# Patient Record
Sex: Male | Born: 1970 | Race: White | Hispanic: No | Smoking: Never smoker
Health system: Southern US, Community
[De-identification: ages and names within clinical notes are randomized; demographics above are authoritative.]

## PROBLEM LIST (undated history)

## (undated) DIAGNOSIS — I456 Pre-excitation syndrome: Secondary | ICD-10-CM

## (undated) DIAGNOSIS — E079 Disorder of thyroid, unspecified: Secondary | ICD-10-CM

## (undated) DIAGNOSIS — K259 Gastric ulcer, unspecified as acute or chronic, without hemorrhage or perforation: Secondary | ICD-10-CM

## (undated) DIAGNOSIS — F329 Major depressive disorder, single episode, unspecified: Secondary | ICD-10-CM

## (undated) DIAGNOSIS — M1990 Unspecified osteoarthritis, unspecified site: Secondary | ICD-10-CM

## (undated) DIAGNOSIS — F32A Depression, unspecified: Secondary | ICD-10-CM

## (undated) DIAGNOSIS — F419 Anxiety disorder, unspecified: Secondary | ICD-10-CM

## (undated) DIAGNOSIS — K219 Gastro-esophageal reflux disease without esophagitis: Secondary | ICD-10-CM

## (undated) HISTORY — DX: Gastro-esophageal reflux disease without esophagitis: K21.9

## (undated) HISTORY — DX: Depression, unspecified: F32.A

## (undated) HISTORY — DX: Major depressive disorder, single episode, unspecified: F32.9

## (undated) HISTORY — PX: TONSILLECTOMY: SUR1361

## (undated) HISTORY — DX: Pre-excitation syndrome: I45.6

## (undated) HISTORY — DX: Gastric ulcer, unspecified as acute or chronic, without hemorrhage or perforation: K25.9

## (undated) HISTORY — DX: Anxiety disorder, unspecified: F41.9

## (undated) HISTORY — DX: Unspecified osteoarthritis, unspecified site: M19.90

## (undated) HISTORY — DX: Disorder of thyroid, unspecified: E07.9

---

## 2000-10-14 ENCOUNTER — Emergency Department (HOSPITAL_COMMUNITY): Admission: EM | Admit: 2000-10-14 | Discharge: 2000-10-14 | Payer: Self-pay | Admitting: Emergency Medicine

## 2017-07-08 DIAGNOSIS — K219 Gastro-esophageal reflux disease without esophagitis: Secondary | ICD-10-CM | POA: Diagnosis not present

## 2017-07-08 DIAGNOSIS — J329 Chronic sinusitis, unspecified: Secondary | ICD-10-CM | POA: Diagnosis not present

## 2017-07-08 DIAGNOSIS — R0789 Other chest pain: Secondary | ICD-10-CM | POA: Diagnosis not present

## 2017-07-08 DIAGNOSIS — B079 Viral wart, unspecified: Secondary | ICD-10-CM | POA: Diagnosis not present

## 2017-07-23 DIAGNOSIS — L0100 Impetigo, unspecified: Secondary | ICD-10-CM | POA: Diagnosis not present

## 2017-07-23 DIAGNOSIS — D2239 Melanocytic nevi of other parts of face: Secondary | ICD-10-CM | POA: Diagnosis not present

## 2017-07-23 DIAGNOSIS — B079 Viral wart, unspecified: Secondary | ICD-10-CM | POA: Diagnosis not present

## 2017-07-23 DIAGNOSIS — L570 Actinic keratosis: Secondary | ICD-10-CM | POA: Diagnosis not present

## 2017-08-27 DIAGNOSIS — B079 Viral wart, unspecified: Secondary | ICD-10-CM | POA: Diagnosis not present

## 2017-09-02 DIAGNOSIS — J329 Chronic sinusitis, unspecified: Secondary | ICD-10-CM | POA: Diagnosis not present

## 2017-09-02 DIAGNOSIS — F418 Other specified anxiety disorders: Secondary | ICD-10-CM | POA: Diagnosis not present

## 2017-09-02 DIAGNOSIS — Z1331 Encounter for screening for depression: Secondary | ICD-10-CM | POA: Diagnosis not present

## 2017-09-02 DIAGNOSIS — K52832 Lymphocytic colitis: Secondary | ICD-10-CM | POA: Diagnosis not present

## 2017-09-02 DIAGNOSIS — K219 Gastro-esophageal reflux disease without esophagitis: Secondary | ICD-10-CM | POA: Diagnosis not present

## 2017-09-17 DIAGNOSIS — B079 Viral wart, unspecified: Secondary | ICD-10-CM | POA: Diagnosis not present

## 2017-09-26 DIAGNOSIS — T63481A Toxic effect of venom of other arthropod, accidental (unintentional), initial encounter: Secondary | ICD-10-CM | POA: Diagnosis not present

## 2017-10-06 DIAGNOSIS — F419 Anxiety disorder, unspecified: Secondary | ICD-10-CM | POA: Diagnosis not present

## 2017-10-06 DIAGNOSIS — F418 Other specified anxiety disorders: Secondary | ICD-10-CM | POA: Diagnosis not present

## 2017-10-06 DIAGNOSIS — M1612 Unilateral primary osteoarthritis, left hip: Secondary | ICD-10-CM | POA: Diagnosis not present

## 2017-10-06 DIAGNOSIS — Z6832 Body mass index (BMI) 32.0-32.9, adult: Secondary | ICD-10-CM | POA: Diagnosis not present

## 2017-10-13 ENCOUNTER — Encounter (INDEPENDENT_AMBULATORY_CARE_PROVIDER_SITE_OTHER): Payer: Self-pay | Admitting: Orthopaedic Surgery

## 2017-10-13 ENCOUNTER — Ambulatory Visit (INDEPENDENT_AMBULATORY_CARE_PROVIDER_SITE_OTHER): Payer: BLUE CROSS/BLUE SHIELD

## 2017-10-13 ENCOUNTER — Ambulatory Visit (INDEPENDENT_AMBULATORY_CARE_PROVIDER_SITE_OTHER): Payer: BLUE CROSS/BLUE SHIELD | Admitting: Orthopaedic Surgery

## 2017-10-13 VITALS — BP 146/102 | HR 81 | Ht 75.0 in | Wt 248.0 lb

## 2017-10-13 DIAGNOSIS — M1612 Unilateral primary osteoarthritis, left hip: Secondary | ICD-10-CM | POA: Diagnosis not present

## 2017-10-13 DIAGNOSIS — M25552 Pain in left hip: Secondary | ICD-10-CM | POA: Diagnosis not present

## 2017-10-13 NOTE — Progress Notes (Signed)
Office Visit Note   Patient: Bryce Kelley           Date of Birth: 03-Sep-1970           MRN: [id number] Visit Date: 10/13/2017              Requested by: Lonie Peak, PA-C [address] [place], [place] [address] PCP: System, Pcp Not In   Assessment & Plan: Visit Diagnoses:  1. Pain in left hip   2. Unilateral primary osteoarthritis, left hip     Plan: We reviewed x-rays today I made copies for him of today's x-rays in 2015 which shows progression from moderate to severe osteoarthritis of his left hip with time.  He is having daily symptoms and works on his feet all day.  He has not missed work as of this point due to his hip symptoms but his pain is progressed.  We discussed total hip arthroplasty with direct anterior approach.  Out of work in the 3 to 6-week range postoperatively.  We discussed spinal anesthesia, direct anterior approach, risks of surgery.  He can discuss this with his wife and call when he is ready to proceed.  Questions were elicited and answered about the procedure.  Follow-Up Instructions: No follow-ups on file.   Orders:  Orders Placed This Encounter  Procedures  . XR HIP UNILAT W OR W/O PELVIS 2-3 VIEWS LEFT   No orders of the defined types were placed in this encounter.     Procedures: No procedures performed   Clinical Data: No additional findings.   Subjective: Chief Complaint  Patient presents with  . Left Hip - Pain    HPI 47 year old male returns with increasing left hip pain in his groin with limp.  He has not been seen since 2015 for radiograph showed moderate left hip osteoarthritis.  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  He is taken Tylenol and also Aleve and tries to avoid taking it every single day.  He is gotten some relief with medication.  His limp is gotten worse and he return for repeat radiographs due to his progression of symptoms.  He has a father at hip  osteoarthritis also brother with some hip problems who has not had surgery.  Patient denies associated back pain.  No bowel or bladder symptoms.  No numbness or tingling.  Patient is not able to cross his legs or figure-of-four his leg.  He notes internal rotation extremely limited in his left hip.  No limitation with right leg range of motion.  Review of Systems positive for thyroid condition, GERD, past history of anxiety, depression and acid reflux.  Currently he is taking Prilosec, the Tylenol and also Aleve.  14 point review of systems otherwise negative as it pertains HPI.   Objective: Vital Signs: BP (!) 146/102   Pulse 81   Ht 6\' 3"  (1.905 m)   Wt 248 lb (112.5 kg)   BMI 31.00 kg/m   Physical Exam  Constitutional: He is oriented to person, place, and time. He appears well-developed and well-nourished.  HENT:  Head: Normocephalic and atraumatic.  Eyes: Pupils are equal, round, and reactive to light. EOM are normal.  Neck: No tracheal deviation present. No thyromegaly present.  Cardiovascular: Normal rate.  Pulmonary/Chest: Effort normal. He has no wheezes.  Abdominal: Soft. Bowel sounds are normal.  Neurological: He is alert and oriented to person, place, and time.  Skin:  Skin is warm and dry. Capillary refill takes less than 2 seconds.  Psychiatric: He has a normal mood and affect. His behavior is normal. Judgment and thought content normal.    Ortho Exam patient ambulates with a positive Trendelenburg gait.  0 degrees internal rotation left hip negative straight leg raising 90 degrees.  Both knees show the slight crepitus without knee effusion normal ligament stability.  Distal pulses are 2+ and symmetrical.  45 degrees internal and external rotation of his right hip.  Left hip is 0 degrees internal rotation only 30 degrees external rotation both with pain.  Anterior tib quads abductors ankle dorsiflexion plantarflexion is normal.  Specialty Comments:  No specialty comments  available.  Imaging: Xr Hip Unilat W Or W/o Pelvis 2-3 Views Left  Result Date: 10/13/2017 Standing AP both hips and frog-leg left hip x-rays are obtained and reviewed compared to 2015 x-rays.  This shows significant progression of left hip osteoarthritis with complete loss of joint space, subchondral sclerosis, 1 cm inferior marginal osteophytes on the left hip.  Right hip is normal without osteoarthritic changes. Impression: Moderate to severe left hip osteoarthritis which has progressed from 2015 images.    PMFS History: There are no active problems to display for this patient.  Past Medical History:  Diagnosis Date  . Acid reflux   . Anxiety   . Arthritis   . Depression   . Gastric ulcer   . Thyroid disease     History reviewed. No pertinent family history.  History reviewed. No pertinent surgical history. Social History   Occupational History  . Not on file  Tobacco Use  . Smoking status: Never Smoker  . Smokeless tobacco: Never Used  Substance and Sexual Activity  . Alcohol use: Not Currently  . Drug use: Not on file  . Sexual activity: Not on file

## 2017-10-13 NOTE — Progress Notes (Deleted)
.  srs 

## 2017-10-31 DIAGNOSIS — B079 Viral wart, unspecified: Secondary | ICD-10-CM | POA: Diagnosis not present

## 2017-11-06 DIAGNOSIS — F418 Other specified anxiety disorders: Secondary | ICD-10-CM | POA: Diagnosis not present

## 2017-11-06 DIAGNOSIS — M1612 Unilateral primary osteoarthritis, left hip: Secondary | ICD-10-CM | POA: Diagnosis not present

## 2017-11-06 DIAGNOSIS — J329 Chronic sinusitis, unspecified: Secondary | ICD-10-CM | POA: Diagnosis not present

## 2017-11-28 DIAGNOSIS — B079 Viral wart, unspecified: Secondary | ICD-10-CM | POA: Diagnosis not present

## 2017-12-14 DIAGNOSIS — T7840XA Allergy, unspecified, initial encounter: Secondary | ICD-10-CM | POA: Diagnosis not present

## 2017-12-14 DIAGNOSIS — L500 Allergic urticaria: Secondary | ICD-10-CM | POA: Diagnosis not present

## 2017-12-15 DIAGNOSIS — M79671 Pain in right foot: Secondary | ICD-10-CM | POA: Diagnosis not present

## 2017-12-15 DIAGNOSIS — L509 Urticaria, unspecified: Secondary | ICD-10-CM | POA: Diagnosis not present

## 2017-12-31 DIAGNOSIS — B079 Viral wart, unspecified: Secondary | ICD-10-CM | POA: Diagnosis not present

## 2018-01-12 DIAGNOSIS — B079 Viral wart, unspecified: Secondary | ICD-10-CM | POA: Diagnosis not present

## 2018-01-19 DIAGNOSIS — J329 Chronic sinusitis, unspecified: Secondary | ICD-10-CM | POA: Diagnosis not present

## 2018-01-19 DIAGNOSIS — Z6831 Body mass index (BMI) 31.0-31.9, adult: Secondary | ICD-10-CM | POA: Diagnosis not present

## 2018-01-19 DIAGNOSIS — H6122 Impacted cerumen, left ear: Secondary | ICD-10-CM | POA: Diagnosis not present

## 2018-01-26 DIAGNOSIS — B079 Viral wart, unspecified: Secondary | ICD-10-CM | POA: Diagnosis not present

## 2018-03-24 ENCOUNTER — Ambulatory Visit (INDEPENDENT_AMBULATORY_CARE_PROVIDER_SITE_OTHER): Payer: BLUE CROSS/BLUE SHIELD | Admitting: Orthopaedic Surgery

## 2018-03-31 DIAGNOSIS — Z6830 Body mass index (BMI) 30.0-30.9, adult: Secondary | ICD-10-CM | POA: Diagnosis not present

## 2018-03-31 DIAGNOSIS — J329 Chronic sinusitis, unspecified: Secondary | ICD-10-CM | POA: Diagnosis not present

## 2018-04-16 DIAGNOSIS — Z6830 Body mass index (BMI) 30.0-30.9, adult: Secondary | ICD-10-CM | POA: Diagnosis not present

## 2018-04-16 DIAGNOSIS — J329 Chronic sinusitis, unspecified: Secondary | ICD-10-CM | POA: Diagnosis not present

## 2018-04-30 DIAGNOSIS — J329 Chronic sinusitis, unspecified: Secondary | ICD-10-CM | POA: Diagnosis not present

## 2018-04-30 DIAGNOSIS — J310 Chronic rhinitis: Secondary | ICD-10-CM | POA: Diagnosis not present

## 2018-05-06 ENCOUNTER — Other Ambulatory Visit: Payer: Self-pay | Admitting: Otolaryngology

## 2018-05-06 DIAGNOSIS — J329 Chronic sinusitis, unspecified: Secondary | ICD-10-CM

## 2018-05-07 ENCOUNTER — Ambulatory Visit
Admission: RE | Admit: 2018-05-07 | Discharge: 2018-05-07 | Disposition: A | Discharge status: Home / Self Care | Payer: BLUE CROSS/BLUE SHIELD | Source: Ambulatory Visit | Attending: Otolaryngology | Admitting: Otolaryngology

## 2018-05-07 DIAGNOSIS — K029 Dental caries, unspecified: Secondary | ICD-10-CM | POA: Diagnosis not present

## 2018-05-07 DIAGNOSIS — J329 Chronic sinusitis, unspecified: Secondary | ICD-10-CM

## 2018-05-08 DIAGNOSIS — J310 Chronic rhinitis: Secondary | ICD-10-CM | POA: Diagnosis not present

## 2018-05-08 DIAGNOSIS — G44201 Tension-type headache, unspecified, intractable: Secondary | ICD-10-CM | POA: Diagnosis not present

## 2018-05-18 DIAGNOSIS — K219 Gastro-esophageal reflux disease without esophagitis: Secondary | ICD-10-CM | POA: Diagnosis not present

## 2018-05-18 DIAGNOSIS — J329 Chronic sinusitis, unspecified: Secondary | ICD-10-CM | POA: Diagnosis not present

## 2018-05-18 DIAGNOSIS — R51 Headache: Secondary | ICD-10-CM | POA: Diagnosis not present

## 2018-05-18 DIAGNOSIS — M1612 Unilateral primary osteoarthritis, left hip: Secondary | ICD-10-CM | POA: Diagnosis not present

## 2018-05-31 DIAGNOSIS — R5383 Other fatigue: Secondary | ICD-10-CM | POA: Diagnosis not present

## 2018-05-31 DIAGNOSIS — R197 Diarrhea, unspecified: Secondary | ICD-10-CM | POA: Diagnosis not present

## 2018-05-31 DIAGNOSIS — R509 Fever, unspecified: Secondary | ICD-10-CM | POA: Diagnosis not present

## 2018-06-04 DIAGNOSIS — R197 Diarrhea, unspecified: Secondary | ICD-10-CM | POA: Diagnosis not present

## 2018-06-04 DIAGNOSIS — R6889 Other general symptoms and signs: Secondary | ICD-10-CM | POA: Diagnosis not present

## 2018-06-04 DIAGNOSIS — Z6830 Body mass index (BMI) 30.0-30.9, adult: Secondary | ICD-10-CM | POA: Diagnosis not present

## 2018-06-23 DIAGNOSIS — H6980 Other specified disorders of Eustachian tube, unspecified ear: Secondary | ICD-10-CM | POA: Diagnosis not present

## 2018-09-21 DIAGNOSIS — Z6831 Body mass index (BMI) 31.0-31.9, adult: Secondary | ICD-10-CM | POA: Diagnosis not present

## 2018-09-21 DIAGNOSIS — M26629 Arthralgia of temporomandibular joint, unspecified side: Secondary | ICD-10-CM | POA: Diagnosis not present

## 2018-09-21 DIAGNOSIS — F418 Other specified anxiety disorders: Secondary | ICD-10-CM | POA: Diagnosis not present

## 2018-09-29 ENCOUNTER — Ambulatory Visit: Payer: BLUE CROSS/BLUE SHIELD | Admitting: Orthopaedic Surgery

## 2018-10-21 DIAGNOSIS — J329 Chronic sinusitis, unspecified: Secondary | ICD-10-CM | POA: Diagnosis not present

## 2018-10-21 DIAGNOSIS — K219 Gastro-esophageal reflux disease without esophagitis: Secondary | ICD-10-CM | POA: Diagnosis not present

## 2018-10-21 DIAGNOSIS — M1612 Unilateral primary osteoarthritis, left hip: Secondary | ICD-10-CM | POA: Diagnosis not present

## 2018-10-21 DIAGNOSIS — K52832 Lymphocytic colitis: Secondary | ICD-10-CM | POA: Diagnosis not present

## 2018-11-26 DIAGNOSIS — Z6832 Body mass index (BMI) 32.0-32.9, adult: Secondary | ICD-10-CM | POA: Diagnosis not present

## 2018-11-26 DIAGNOSIS — L309 Dermatitis, unspecified: Secondary | ICD-10-CM | POA: Diagnosis not present

## 2019-02-12 ENCOUNTER — Encounter (INDEPENDENT_AMBULATORY_CARE_PROVIDER_SITE_OTHER): Payer: Self-pay

## 2019-03-30 ENCOUNTER — Ambulatory Visit: Payer: BC Managed Care – PPO | Admitting: Orthopaedic Surgery

## 2019-03-30 ENCOUNTER — Encounter: Payer: Self-pay | Admitting: Orthopaedic Surgery

## 2019-03-30 ENCOUNTER — Ambulatory Visit: Payer: Self-pay

## 2019-03-30 ENCOUNTER — Other Ambulatory Visit: Payer: Self-pay

## 2019-03-30 VITALS — Ht 75.0 in | Wt 253.0 lb

## 2019-03-30 DIAGNOSIS — M25552 Pain in left hip: Secondary | ICD-10-CM | POA: Diagnosis not present

## 2019-03-30 DIAGNOSIS — M1612 Unilateral primary osteoarthritis, left hip: Secondary | ICD-10-CM | POA: Insufficient documentation

## 2019-03-30 NOTE — Progress Notes (Signed)
Office Visit Note   Patient: Bryce Kelley           Date of Birth: 1970-12-11           MRN: [id number] Visit Date: 03/30/2019              Requested by: No referring provider defined for this encounter. PCP: System, Pcp Not In   Assessment & Plan: Visit Diagnoses:  1. Pain in left hip   2. Unilateral primary osteoarthritis, left hip     Plan: Patient is been thinking about total hip arthroplasty for the last 18 months.  He states the pain is progressed to the point where he is unable to handle it and wants to proceed with total hip arthroplasty.  We discussed direct anterior approach likely overnight stay since he is active strong and his opposite hip is normal.  We discussed anterior approach risks of surgery discussed.  Questions were elicited and answered he understands request we proceed.  Decision for surgery made today.  Follow-Up Instructions: No follow-ups on file.   Orders:  Orders Placed This Encounter  Procedures  . XR HIP UNILAT W OR W/O PELVIS 2-3 VIEWS LEFT   No orders of the defined types were placed in this encounter.     Procedures: No procedures performed   Clinical Data: No additional findings.   Subjective: Chief Complaint  Patient presents with  . Left Hip - Pain    HPI 48 year old male returns with severe unilateral left hip arthritis.  He has been followed since 2015 and he states the pain is now severe requiring him to ambulate with a cane or crutches.  He has had progression since 2019 and now has no joint space left with large marginal osteophyte subchondral sclerosis.  He denies back pain no numbness or tingling.  He has been on anti-inflammatories has had intra-articular injections in the past.  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  He is used Aleve regularly as well as Tylenol is no longer getting relief he is walking and limping.  Both his father and brother have had hip osteoarthritis problems father said  treatment.  He denies bowel bladder symptoms.  He has trouble getting his socks on someone else has to help him with shoes.  He cannot cross his legs.  With ambulation he is to keep his left hip and external rotation since he cannot internally rotate to neutral position.  Review of Systems 14 point view of system positive for history of GERD, history of anxiety depression acid reflux.  Previous thyroid condition.  He is on Prilosec for his GERD.  Negative for cardiac GU neurologic problems.   Objective: Vital Signs: Ht 6\' 3"  (1.905 m)   Wt 253 lb (114.8 kg)   BMI 31.62 kg/m   Physical Exam Constitutional:      Appearance: He is well-developed.  HENT:     Head: Normocephalic and atraumatic.  Eyes:     Pupils: Pupils are equal, round, and reactive to light.  Neck:     Thyroid: No thyromegaly.     Trachea: No tracheal deviation.  Cardiovascular:     Rate and Rhythm: Normal rate.  Pulmonary:     Effort: Pulmonary effort is normal.     Breath sounds: No wheezing.  Abdominal:     General: Bowel sounds are normal.     Palpations: Abdomen is soft.  Skin:    General: Skin is warm and  dry.     Capillary Refill: Capillary refill takes less than 2 seconds.  Neurological:     Mental Status: He is alert and oriented to person, place, and time.  Psychiatric:        Behavior: Behavior normal.        Thought Content: Thought content normal.        Judgment: Judgment normal.     Ortho Exam patient has 20 degree hip flexion contracture.  Right hip has 30 degrees internal/external rotation without pain.  Left hip is in 30 degrees external rotation and will not internally rotated all from that position externally rotates possibly 5 degrees at maximum.  He only has 60 degrees of hip flexion.  No hip extension.  Distal pulses are intact knee and ankle jerk is intact anterior tib are strong quad is without atrophy.  Specialty Comments:  No specialty comments available.  Imaging: Xr Hip Unilat  W Or W/o Pelvis 2-3 Views Left  Result Date: 03/31/2019 Standing AP pelvis frog-leg lateral left hip obtained and reviewed.  This shows normal right hip severe hip osteoarthritis on the left hip.  There is 2 cm osteophytes no joint space subchondral sclerosis subchondral cyst formation and some lateral subluxation of the head with flattening deformity of the femoral head. Impression: Severe unilateral left hip osteoarthritis, progressive.    PMFS History: Patient Active Problem List   Diagnosis Date Noted  . Unilateral primary osteoarthritis, left hip 03/30/2019   Past Medical History:  Diagnosis Date  . Acid reflux   . Anxiety   . Arthritis   . Depression   . Gastric ulcer   . Thyroid disease     No family history on file.  No past surgical history on file. Social History   Occupational History  . Not on file  Tobacco Use  . Smoking status: Never Smoker  . Smokeless tobacco: Never Used  Substance and Sexual Activity  . Alcohol use: Not Currently  . Drug use: Not on file  . Sexual activity: Not on file

## 2019-03-31 ENCOUNTER — Encounter: Payer: Self-pay | Admitting: Orthopaedic Surgery

## 2019-04-09 ENCOUNTER — Telehealth: Payer: Self-pay | Admitting: Orthopaedic Surgery

## 2019-04-09 NOTE — Telephone Encounter (Signed)
Unable to reach patient because voice mail is full. Today I sent an e-mail to patient's address on file asking that he get in touch with me to provide information for clearance purposes so we can get him set up for left total hip arthoplasty with Dr. Lorin Mercy.

## 2019-04-26 DIAGNOSIS — K219 Gastro-esophageal reflux disease without esophagitis: Secondary | ICD-10-CM | POA: Diagnosis not present

## 2019-04-26 DIAGNOSIS — I456 Pre-excitation syndrome: Secondary | ICD-10-CM | POA: Diagnosis not present

## 2019-04-26 DIAGNOSIS — J329 Chronic sinusitis, unspecified: Secondary | ICD-10-CM | POA: Diagnosis not present

## 2019-04-26 DIAGNOSIS — M1612 Unilateral primary osteoarthritis, left hip: Secondary | ICD-10-CM | POA: Diagnosis not present

## 2019-04-26 DIAGNOSIS — Z79899 Other long term (current) drug therapy: Secondary | ICD-10-CM | POA: Diagnosis not present

## 2019-05-06 ENCOUNTER — Encounter: Payer: Self-pay | Admitting: Cardiology

## 2019-05-06 DIAGNOSIS — I456 Pre-excitation syndrome: Secondary | ICD-10-CM | POA: Insufficient documentation

## 2019-05-07 ENCOUNTER — Ambulatory Visit (INDEPENDENT_AMBULATORY_CARE_PROVIDER_SITE_OTHER): Payer: BC Managed Care – PPO | Admitting: Cardiology

## 2019-05-07 ENCOUNTER — Encounter: Payer: Self-pay | Admitting: Cardiology

## 2019-05-07 ENCOUNTER — Other Ambulatory Visit: Payer: Self-pay

## 2019-05-07 DIAGNOSIS — R0600 Dyspnea, unspecified: Secondary | ICD-10-CM | POA: Diagnosis not present

## 2019-05-07 DIAGNOSIS — R0609 Other forms of dyspnea: Secondary | ICD-10-CM

## 2019-05-07 DIAGNOSIS — K219 Gastro-esophageal reflux disease without esophagitis: Secondary | ICD-10-CM

## 2019-05-07 DIAGNOSIS — I456 Pre-excitation syndrome: Secondary | ICD-10-CM

## 2019-05-07 HISTORY — DX: Gastro-esophageal reflux disease without esophagitis: K21.9

## 2019-05-07 HISTORY — DX: Other forms of dyspnea: R06.09

## 2019-05-07 HISTORY — DX: Dyspnea, unspecified: R06.00

## 2019-05-07 NOTE — Patient Instructions (Signed)
Medication Instructions:  Your physician recommends that you continue on your current medications as directed. Please refer to the Current Medication list given to you today.  *If you need a refill on your cardiac medications before your next appointment, please call your pharmacy*  Lab Work: None.  If you have labs (blood work) drawn today and your tests are completely normal, you will receive your results only by: . MyChart Message (if you have MyChart) OR . A paper copy in the mail If you have any lab test that is abnormal or we need to change your treatment, we will call you to review the results.  Testing/Procedures: Your physician has requested that you have an echocardiogram. Echocardiography is a painless test that uses sound waves to create images of your heart. It provides your doctor with information about the size and shape of your heart and how well your heart's chambers and valves are working. This procedure takes approximately one hour. There are no restrictions for this procedure.    Follow-Up: At [facility], you and your health needs are our priority.  As part of our continuing mission to provide you with exceptional heart care, we have created designated Provider Care Teams.  These Care Teams include your primary Cardiologist (physician) and Advanced Practice Providers (APPs -  Physician Assistants and Nurse Practitioners) who all work together to provide you with the care you need, when you need it.  Your next appointment:   4 month(s)  The format for your next appointment:   In Person  Provider:   Robert Krasowski, MD  Other Instructions   Echocardiogram An echocardiogram is a procedure that uses painless sound waves (ultrasound) to produce an image of the heart. Images from an echocardiogram can provide important information about:  Signs of coronary artery disease (CAD).  Aneurysm detection. An aneurysm is a weak or damaged part of an artery wall that  bulges out from the normal force of blood pumping through the body.  Heart size and shape. Changes in the size or shape of the heart can be associated with certain conditions, including heart failure, aneurysm, and CAD.  Heart muscle function.  Heart valve function.  Signs of a past heart attack.  Fluid buildup around the heart.  Thickening of the heart muscle.  A tumor or infectious growth around the heart valves. Tell a health care provider about:  Any allergies you have.  All medicines you are taking, including vitamins, herbs, eye drops, creams, and over-the-counter medicines.  Any blood disorders you have.  Any surgeries you have had.  Any medical conditions you have.  Whether you are pregnant or may be pregnant. What are the risks? Generally, this is a safe procedure. However, problems may occur, including:  Allergic reaction to dye (contrast) that may be used during the procedure. What happens before the procedure? No specific preparation is needed. You may eat and drink normally. What happens during the procedure?   An IV tube may be inserted into one of your veins.  You may receive contrast through this tube. A contrast is an injection that improves the quality of the pictures from your heart.  A gel will be applied to your chest.  A wand-like tool (transducer) will be moved over your chest. The gel will help to transmit the sound waves from the transducer.  The sound waves will harmlessly bounce off of your heart to allow the heart images to be captured in real-time motion. The images will be recorded   on a computer. The procedure may vary among health care providers and hospitals. What happens after the procedure?  You may return to your normal, everyday life, including diet, activities, and medicines, unless your health care provider tells you not to do that. Summary  An echocardiogram is a procedure that uses painless sound waves (ultrasound) to produce  an image of the heart.  Images from an echocardiogram can provide important information about the size and shape of your heart, heart muscle function, heart valve function, and fluid buildup around your heart.  You do not need to do anything to prepare before this procedure. You may eat and drink normally.  After the echocardiogram is completed, you may return to your normal, everyday life, unless your health care provider tells you not to do that. This information is not intended to replace advice given to you by your health care provider. Make sure you discuss any questions you have with your health care provider. Document Revised: 07/30/2018 Document Reviewed: 05/11/2016 Elsevier Patient Education  2020 Elsevier Inc[employer]   

## 2019-05-07 NOTE — Progress Notes (Signed)
Cardiology Consultation:    Date:  05/07/2019   ID:  Bryce Kelley, DOB [birth year], MRN [id number]  PCP:  Lonie Peak, PA-C  Cardiologist:  Gypsy Balsam, MD   Referring MD: Lonie Peak, PA-C   Chief Complaint  Patient presents with  . New Patient (Initial Visit)    Cardiac clearance for surgery    History of Present Illness:    Bryce Kelley is a 49 y.o. male who is being seen today for the evaluation of I get WPW I need surgery at the request of Lonie Peak, PA-C.  Years ago he was told to have WPW.  He does not have any symptoms related to it.  He described only some extra beats and some skipped beats but no sustained arrhythmias.  He is able to walk climb stairs with no difficulties.  He does have some hip problem and he required hip surgery in spite of that he tells me he can walk easily from front to the back of [employer] he also tells me that he can climb at least 1-2 flights of stairs without stopping and without any chest pain.  The only thing he feels is some shortness of breath. He does not smoke never did. He is not sure what his cholesterol is He never passed out does not have any dizziness, palpitations as described above Family history significant for premature coronary disease.  His father had heart problem before age of 40. There is no sudden cardiac death in his family.  Past Medical History:  Diagnosis Date  . Acid reflux   . Anxiety   . Arthritis   . Depression   . Gastric ulcer   . Thyroid disease   . Wolff-Parkinson-White syndrome     History reviewed. No pertinent surgical history.  Current Medications: Current Meds  Medication Sig  . ALPRAZolam (XANAX) 0.25 MG tablet Take 0.25 mg by mouth at bedtime as needed for anxiety.  Marland Kitchen omeprazole (PRILOSEC) 20 MG capsule Take 20 mg by mouth 2 (two) times daily.     Allergies:   Amoxicillin and Motrin [ibuprofen]   Social History   Socioeconomic History  . Marital status: Married   Spouse name: Not on file  . Number of children: Not on file  . Years of education: Not on file  . Highest education level: Not on file  Occupational History  . Not on file  Tobacco Use  . Smoking status: Never Smoker  . Smokeless tobacco: Never Used  Substance and Sexual Activity  . Alcohol use: Not Currently  . Drug use: Not on file  . Sexual activity: Not on file  Other Topics Concern  . Not on file  Social History Narrative  . Not on file   Social Determinants of Health   Financial Resource Strain:   . Difficulty of Paying Living Expenses: Not on file  Food Insecurity:   . Worried About Programme researcher, broadcasting/film/video in the Last Year: Not on file  . Ran Out of Food in the Last Year: Not on file  Transportation Needs:   . Lack of Transportation (Medical): Not on file  . Lack of Transportation (Non-Medical): Not on file  Physical Activity:   . Days of Exercise per Week: Not on file  . Minutes of Exercise per Session: Not on file  Stress:   . Feeling of Stress : Not on file  Social Connections:   . Frequency of Communication with Friends and Family: Not on file  . Frequency  of Social Gatherings with Friends and Family: Not on file  . Attends Religious Services: Not on file  . Active Member of Clubs or Organizations: Not on file  . Attends Archivist Meetings: Not on file  . Marital Status: Not on file     Family History: The patient's family history includes Heart attack in his father. ROS:   Please see the history of present illness.    All 14 point review of systems negative except as described per history of present illness.  EKGs/Labs/Other Studies Reviewed:    The following studies were reviewed today:   EKG:  EKG is  ordered today.  The ekg ordered today demonstrates EKG showed normal sinus rhythm normal P interval incomplete right bundle branch block no ST segment changes and I do not see clear-cut delta wave in this EKG.  Recent Labs: No results found  for requested labs within last 8760 hours.  Recent Lipid Panel No results found for: CHOL, TRIG, HDL, CHOLHDL, VLDL, LDLCALC, LDLDIRECT  Physical Exam:    VS:  BP 140/90   Pulse 76   Ht 6\' 3"  (1.905 m)   Wt 253 lb (114.8 kg)   SpO2 98%   BMI 31.62 kg/m     Wt Readings from Last 3 Encounters:  05/07/19 253 lb (114.8 kg)  03/30/19 253 lb (114.8 kg)  10/13/17 248 lb (112.5 kg)     GEN:  Well nourished, well developed in no acute distress HEENT: Normal NECK: No JVD; No carotid bruits LYMPHATICS: No lymphadenopathy CARDIAC: RRR, no murmurs, no rubs, no gallops RESPIRATORY:  Clear to auscultation without rales, wheezing or rhonchi  ABDOMEN: Soft, non-tender, non-distended MUSCULOSKELETAL:  No edema; No deformity  SKIN: Warm and dry NEUROLOGIC:  Alert and oriented x 3 PSYCHIATRIC:  Normal affect   ASSESSMENT:    1. Wolff-Parkinson-White syndrome   2. Dyspnea on exertion   3. Gastroesophageal reflux disease without esophagitis    PLAN:    In order of problems listed above:  1. Wolff-Parkinson-White syndrome.  He denies having any sustained arrhythmia his EKG is not diagnostic.  I told him I doubt this diagnosis.  I will try to retrieve some previous EKG to see if any of this EKG will show some more pronounced delta wave because what I see on my EKG today is not convincing.  Again likely he does not have any sustained arrhythmias.  In the future we may put some monitor on him however for now I think the best approach is to do echocardiogram to make sure structurally his heart is normal.  He described to have some shortness of breath with exertion and again will answer this by doing echocardiogram. 2. History of gastroesophageal reflux disease.  Stable. 3. Preop evaluation for hip surgery.  Will wait for echocardiogram if echocardiogram is fine should be able to proceed with surgery as scheduled.  I will consider him low risk.  If he develop any arrhythmia related to WPW we can deal  with this well during surgery.  But again I question accuracy of this diagnosis.   Medication Adjustments/Labs and Tests Ordered: Current medicines are reviewed at length with the patient today.  Concerns regarding medicines are outlined above.  No orders of the defined types were placed in this encounter.  No orders of the defined types were placed in this encounter.   Signed, Park Liter, MD, [facility]. 05/07/2019 3:02 PM    [facility]

## 2019-05-10 DIAGNOSIS — I456 Pre-excitation syndrome: Secondary | ICD-10-CM | POA: Diagnosis not present

## 2019-05-10 DIAGNOSIS — L309 Dermatitis, unspecified: Secondary | ICD-10-CM | POA: Diagnosis not present

## 2019-05-10 DIAGNOSIS — Z6833 Body mass index (BMI) 33.0-33.9, adult: Secondary | ICD-10-CM | POA: Diagnosis not present

## 2019-05-13 ENCOUNTER — Other Ambulatory Visit: Payer: Self-pay

## 2019-05-13 ENCOUNTER — Ambulatory Visit (HOSPITAL_BASED_OUTPATIENT_CLINIC_OR_DEPARTMENT_OTHER)
Admission: RE | Admit: 2019-05-13 | Discharge: 2019-05-13 | Disposition: A | Discharge status: Home / Self Care | Payer: BC Managed Care – PPO | Source: Ambulatory Visit | Attending: Cardiology | Admitting: Cardiology

## 2019-05-13 DIAGNOSIS — R0600 Dyspnea, unspecified: Secondary | ICD-10-CM | POA: Diagnosis not present

## 2019-05-13 NOTE — Progress Notes (Signed)
  Echocardiogram 2D Echocardiogram has been performed.  Bryce Kelley 05/13/2019, 2:54 PM

## 2019-05-19 ENCOUNTER — Telehealth: Payer: Self-pay | Admitting: Emergency Medicine

## 2019-05-19 NOTE — Telephone Encounter (Signed)
Left results to echocardiogram on voicemail per dpr advised patient to return call with any questions.

## 2019-05-20 NOTE — Telephone Encounter (Signed)
Follow Up ° °Patient is returning call. Please give patient a call back.  °

## 2019-05-20 NOTE — Telephone Encounter (Signed)
Called patient back. Informed him of results.  

## 2019-07-27 ENCOUNTER — Ambulatory Visit: Payer: Self-pay

## 2019-07-27 ENCOUNTER — Other Ambulatory Visit: Payer: Self-pay

## 2019-07-27 ENCOUNTER — Ambulatory Visit: Payer: BC Managed Care – PPO | Admitting: Orthopaedic Surgery

## 2019-07-27 VITALS — BP 138/89 | HR 123 | Ht 75.0 in | Wt 256.0 lb

## 2019-07-27 DIAGNOSIS — M25562 Pain in left knee: Secondary | ICD-10-CM | POA: Diagnosis not present

## 2019-07-27 DIAGNOSIS — M1612 Unilateral primary osteoarthritis, left hip: Secondary | ICD-10-CM | POA: Diagnosis not present

## 2019-07-27 NOTE — Progress Notes (Signed)
Office Visit Note   Patient: Bryce Kelley           Date of Birth: 03/20/1971           MRN: [id number] Visit Date: 07/27/2019              Requested by: Lonie Peak, PA-C [address] [place],  [place] [address] PCP: Lonie Peak, PA-C   Assessment & Plan: Visit Diagnoses: left hip primary osteoarthritis   Plan: Patient would like to  proceed with left total hip arthroplasty. We discussed spinal anesthesia, use of Exparel and Marcaine.  Transition from walker/crutches to cane.  Risks of surgery discussed questions were elicited and answered.  He understands and requests to proceed.  Follow-Up Instructions: No follow-ups on file.   Orders:  Orders Placed This Encounter  Procedures  . XR KNEE 3 VIEW LEFT   No orders of the defined types were placed in this encounter.     Procedures: No procedures performed   Clinical Data: No additional findings.   Subjective: Chief Complaint  Patient presents with  . Left Hip - Pain  . Left Knee - Pain    HPI 49 year old male's been followed for 2 years for severe left hip osteoarthritis.  He had planned total hip arthroplasty last year but delayed it due to Covid concerns.  He is having difficulty walking is been through anti-inflammatories use of crutches.  He has bone-on-bone changes in his hip to centimeter osteophytes no joint space left.  Subchondral sclerosis and subchondral cyst formation with some lateral subluxation of the head and flattening from femoral head erosion.  Opposite right hip does not bother him.  He has pain in the groin that radiates down to his knee at times he has great difficulty walking he has trouble sleeping at night.  When he rolls over the hip wakes him up.  Review of Systems positive for left hip osteoarthritis.  Positive for GERD history of anxiety depression and reflux.  Previous thyroid condition not on any supplements.  He takes Prilosec for his GERD.  Negative for stroke chest pain  negative for anemia.  Negative back discomfort.  14 point systems otherwise negative as pertains HPI.   Objective: Vital Signs: BP 138/89   Pulse (!) 123   Ht 6\' 3"  (1.905 m)   Wt 256 lb (116.1 kg)   BMI 32.00 kg/m   Physical Exam Constitutional:      Appearance: He is well-developed.  HENT:     Head: Normocephalic and atraumatic.  Eyes:     Pupils: Pupils are equal, round, and reactive to light.  Neck:     Thyroid: No thyromegaly.     Trachea: No tracheal deviation.  Cardiovascular:     Rate and Rhythm: Normal rate.  Pulmonary:     Effort: Pulmonary effort is normal.     Breath sounds: No wheezing.  Abdominal:     General: Bowel sounds are normal.     Palpations: Abdomen is soft.  Skin:    General: Skin is warm and dry.     Capillary Refill: Capillary refill takes less than 2 seconds.  Neurological:     Mental Status: He is alert and oriented to person, place, and time.  Psychiatric:        Behavior: Behavior normal.        Thought Content: Thought content normal.        Judgment: Judgment normal.     Ortho Exam patient has good  internal and external rotation of his right hip 30 degrees no pain or discomfort he can easily figure 4 with the right leg.  Left hip has 20 degree flexion contracture.  He is in 30 degrees external rotation and cannot internally rotate his hip.  He ambulates with a pronounced Trendelenburg gait with left foot and external rotation.  No internal rotation of the left hip he can actually rotate 5 degrees.  Difficulty reaching his ankle.  Knee has no swelling distal pulses are intact no sciatic notch tenderness.  No quad atrophy.  Abductor is strong.  Specialty Comments:  No specialty comments available.  Imaging: Imaging: Xr Hip Unilat W Or W/o Pelvis 2-3 Views Left  Result Date: 03/31/2019 Standing AP pelvis frog-leg lateral left hip obtained and reviewed.  This shows normal right hip severe hip osteoarthritis on the left hip.  There is 2 cm  osteophytes no joint space subchondral sclerosis subchondral cyst formation and some lateral subluxation of the head with flattening deformity of the femoral head. Impression: Severe unilateral left hip osteoarthritis, progressive.    PMFS History: Patient Active Problem List   Diagnosis Date Noted  . Dyspnea on exertion 05/07/2019  . GERD (gastroesophageal reflux disease) 05/07/2019  . Unilateral primary osteoarthritis, left hip 03/30/2019   Past Medical History:  Diagnosis Date  . Acid reflux   . Anxiety   . Arthritis   . Depression   . Gastric ulcer   . Thyroid disease   . Wolff-Parkinson-White syndrome     Family History  Problem Relation Age of Onset  . Heart attack Father     No past surgical history on file. Social History   Occupational History  . Not on file  Tobacco Use  . Smoking status: Never Smoker  . Smokeless tobacco: Never Used  Substance and Sexual Activity  . Alcohol use: Not Currently  . Drug use: Not on file  . Sexual activity: Not on file

## 2019-07-28 ENCOUNTER — Telehealth: Payer: Self-pay | Admitting: *Deleted

## 2019-07-28 NOTE — Telephone Encounter (Signed)
Left voice mail to call back 

## 2019-07-28 NOTE — Telephone Encounter (Signed)
   [facility] Pre-operative Risk Assessment    Request for surgical clearance:  1. What type of surgery is being performed? Total Hip  Arthroplasty  2. When is this surgery scheduled? TBD   3. What type of clearance is required (medical clearance vs. Pharmacy clearance to hold med vs. Both)? Medical and Pharmacy  4. Are there any medications that need to be held prior to surgery and how long? No  5. Practice name and name of physician performing surgery? [facility], Dr. Rodell Perna   6. What is your office phone number [phone]    7.   What is your office fax number [phone]  8.   Anesthesia type (None, local, MAC, general) ? Spinal   Bryce Kelley 07/28/2019, 10:48 AM  _________________________________________________________________   (provider comments below)

## 2019-08-04 ENCOUNTER — Telehealth: Payer: Self-pay

## 2019-08-04 NOTE — Telephone Encounter (Signed)
   [facility] Pre-operative Risk Assessment    Request for surgical clearance:  1. What type of surgery is being performed? Left Total Hip Arthroplasty   2. When is this surgery scheduled?  TBD   3. What type of clearance is required (medical clearance vs. Pharmacy clearance to hold med vs. Both)? Medical  4. Are there any medications that need to be held prior to surgery and how long? None noted.   5. Practice name and name of physician performing surgery? [facility].    6. What is your office phone number [phone]    7.   What is your office fax number [phone]  8.   Anesthesia type (None, local, MAC, general) ? Spinal   Gita Kudo 08/04/2019, 10:13 AM  _________________________________________________________________   (provider comments below)

## 2019-08-04 NOTE — Telephone Encounter (Signed)
   Primary Cardiologist: Gypsy Balsam, MD  Left a voicemail for patient to call back for ongoing preop assessment.   Beatriz Stallion, PA-C 08/04/2019, 11:37 AM

## 2019-08-04 NOTE — Telephone Encounter (Signed)
   Primary Cardiologist: Gypsy Balsam, MD  Chart reviewed as part of pre-operative protocol coverage.   Duplicate request. See telephone note 07/28/19.   I will remove from pre-op pool.   Beatriz Stallion, PA-C 08/04/2019, 11:35 AM

## 2019-08-09 ENCOUNTER — Telehealth: Payer: Self-pay | Admitting: Orthopaedic Surgery

## 2019-08-09 NOTE — Telephone Encounter (Signed)
Patient calling to find out if his FMLA has been completed.  He has asked that we hold May 10th for his left total hip arthroplasty, however we are still waiting on a clearance from Cardiology.  Pt's cb  [phone]

## 2019-08-10 NOTE — Telephone Encounter (Signed)
   Primary Cardiologist: Gypsy Balsam, MD  Chart reviewed as part of pre-operative protocol coverage. Patient was contacted 08/10/2019 in reference to pre-operative risk assessment for pending surgery as outlined below.  Bryce Kelley was last seen on 05/07/19 by Dr. Bing Matter.  Since that day, Bryce Kelley has done well. Echocardiogram for dyspnea was unremarkable. He can complete more than 4.0 METS without angina.   Therefore, based on ACC/AHA guidelines, the patient would be at acceptable risk for the planned procedure without further cardiovascular testing.   I will route this recommendation to the requesting party via Epic fax function and remove from pre-op pool.  Please call with questions.  Bryce Rutherford Harrold Fitchett, PA 08/10/2019, 1:58 PM

## 2019-08-10 NOTE — Telephone Encounter (Signed)
I left voicemail advising. Paperwork cannot be completed until definite surgery date. Still awaiting cardiac clearance.

## 2019-08-12 ENCOUNTER — Other Ambulatory Visit: Payer: Self-pay

## 2019-08-19 ENCOUNTER — Encounter: Payer: Self-pay | Admitting: Surgery

## 2019-08-19 ENCOUNTER — Telehealth: Payer: Self-pay | Admitting: Orthopaedic Surgery

## 2019-08-19 ENCOUNTER — Ambulatory Visit (INDEPENDENT_AMBULATORY_CARE_PROVIDER_SITE_OTHER): Payer: BC Managed Care – PPO | Admitting: Surgery

## 2019-08-19 ENCOUNTER — Other Ambulatory Visit: Payer: Self-pay

## 2019-08-19 VITALS — BP 147/89 | HR 99 | Temp 97.8°F

## 2019-08-19 DIAGNOSIS — M1612 Unilateral primary osteoarthritis, left hip: Secondary | ICD-10-CM

## 2019-08-19 NOTE — Progress Notes (Signed)
49 year old male history of end-stage DJD left hip and pain comes in for preop evaluation.  Symptoms unchanged from previous visit and he is want to proceed with left total hip replacement as scheduled.  Today history and physical performed.  Surgical procedure discussed.  All questions answered.  Patient was seen by cardiology for clearance and I need to make sure that we have that.

## 2019-08-19 NOTE — Telephone Encounter (Signed)
Patient stopped by the check in/check out desk and asked about the paper he dropped off two months ago, that is suppose to be signed by Dr. Ophelia Charter.  He is needing to get the paper before his surgery on the 10th so that he can give it to his work.  [phone].  Thank you.

## 2019-08-20 NOTE — Telephone Encounter (Signed)
Copy at front desk for pick up. I left voicemail for patient advising.

## 2019-08-25 NOTE — Progress Notes (Signed)
[id number] - [place], [place] [address] Phone: [phone] Fax: [phone]      Your procedure is scheduled on Monday, Aug 30, 2019.  Report to [facility] Entrance "A" at 1:00 P.M., and check in at the Admitting office.  Call this number if you have problems the morning of surgery:  [phone]  Call [phone] if you have any questions prior to your surgery date Monday-Friday 8am-4pm    Remember:  Do not eat or drink after midnight the night before your surgery    Take these medicines the morning of surgery with A SIP OF WATER:  loratadine (CLARITIN) montelukast (SINGULAIR) omeprazole (PRILOSEC) ALPRAZolam (XANAX) - if needed   As of today, STOP taking any Aspirin (unless otherwise instructed by your surgeon) and Aspirin containing products, Aleve, Naproxen, Ibuprofen, Motrin, Advil, Goody's, BC's, all herbal medications, fish oil, and all vitamins.                      Do not wear jewelry.            Do not wear lotions, powders, colognes, or deodorant.            Men may shave face and neck.            Do not bring valuables to the hospital.            [facility] is not responsible for any belongings or valuables.  Do NOT Smoke (Tobacco/Vapping) or drink Alcohol 24 hours prior to your procedure If you use a CPAP at night, you may bring all equipment for your overnight stay.   Contacts, glasses, dentures or bridgework may not be worn into surgery.      For patients admitted to the hospital, discharge time will be determined by your treatment team.   Patients discharged the day of surgery will not be allowed to drive home, and someone needs to stay with them for 24 hours.    Special instructions:   [facility]- Preparing For Surgery  Before surgery, you can play an important role. Because skin is not sterile, your skin needs to be as free of germs as possible. You can  reduce the number of germs on your skin by washing with CHG (chlorahexidine gluconate) Soap before surgery.  CHG is an antiseptic cleaner which kills germs and bonds with the skin to continue killing germs even after washing.    Oral Hygiene is also important to reduce your risk of infection.  Remember - BRUSH YOUR TEETH THE MORNING OF SURGERY WITH YOUR REGULAR TOOTHPASTE  Please do not use if you have an allergy to CHG or antibacterial soaps. If your skin becomes reddened/irritated stop using the CHG.  Do not shave (including legs and underarms) for at least 48 hours prior to first CHG shower. It is OK to shave your face.  Please follow these instructions carefully.   1. Shower the NIGHT BEFORE SURGERY and the MORNING OF SURGERY with CHG Soap.   2. If you chose to wash your hair, wash your hair first as usual with your normal shampoo.  3. After you shampoo, rinse your hair and body thoroughly to remove the shampoo.  4. Use CHG as you would any other liquid soap. You can apply CHG directly to the skin and wash gently with a scrungie or a clean washcloth.   5. Apply the CHG Soap to  your body ONLY FROM THE NECK DOWN.  Do not use on open wounds or open sores. Avoid contact with your eyes, ears, mouth and genitals (private parts). Wash Face and genitals (private parts)  with your normal soap.   6. Wash thoroughly, paying special attention to the area where your surgery will be performed.  7. Thoroughly rinse your body with warm water from the neck down.  8. DO NOT shower/wash with your normal soap after using and rinsing off the CHG Soap.  9. Pat yourself dry with a CLEAN TOWEL.  10. Wear CLEAN PAJAMAS to bed the night before surgery, wear comfortable clothes the morning of surgery  11. Place CLEAN SHEETS on your bed the night of your first shower and DO NOT SLEEP WITH PETS.   Day of Surgery:   Do not apply any deodorants/lotions.  Please wear clean clothes to the hospital/surgery  center.   Remember to brush your teeth WITH YOUR REGULAR TOOTHPASTE.   Please read over the following fact sheets that you were given.

## 2019-08-26 ENCOUNTER — Other Ambulatory Visit: Payer: Self-pay

## 2019-08-26 ENCOUNTER — Encounter (HOSPITAL_COMMUNITY): Payer: Self-pay

## 2019-08-26 ENCOUNTER — Encounter (HOSPITAL_COMMUNITY)
Admission: RE | Admit: 2019-08-26 | Discharge: 2019-08-26 | Disposition: A | Discharge status: Home / Self Care | Payer: BC Managed Care – PPO | Source: Ambulatory Visit | Attending: Orthopaedic Surgery | Admitting: Orthopaedic Surgery

## 2019-08-26 ENCOUNTER — Other Ambulatory Visit (HOSPITAL_COMMUNITY)
Admission: RE | Admit: 2019-08-26 | Discharge: 2019-08-26 | Disposition: A | Discharge status: Home / Self Care | Payer: BC Managed Care – PPO | Source: Ambulatory Visit | Attending: Orthopaedic Surgery | Admitting: Orthopaedic Surgery

## 2019-08-26 DIAGNOSIS — Z20822 Contact with and (suspected) exposure to covid-19: Secondary | ICD-10-CM | POA: Insufficient documentation

## 2019-08-26 DIAGNOSIS — Z01812 Encounter for preprocedural laboratory examination: Secondary | ICD-10-CM | POA: Diagnosis not present

## 2019-08-26 LAB — CBC
HCT: 48.4 % (ref 39.0–52.0)
Hemoglobin: 15.9 g/dL (ref 13.0–17.0)
MCH: 30.5 pg (ref 26.0–34.0)
MCHC: 32.9 g/dL (ref 30.0–36.0)
MCV: 92.7 fL (ref 80.0–100.0)
Platelets: 216 10*3/uL (ref 150–400)
RBC: 5.22 MIL/uL (ref 4.22–5.81)
RDW: 13.5 % (ref 11.5–15.5)
WBC: 5 10*3/uL (ref 4.0–10.5)
nRBC: 0 % (ref 0.0–0.2)

## 2019-08-26 LAB — SURGICAL PCR SCREEN
MRSA, PCR: NEGATIVE
Staphylococcus aureus: POSITIVE — AB

## 2019-08-26 LAB — SARS CORONAVIRUS 2 (TAT 6-24 HRS): SARS Coronavirus 2: NEGATIVE

## 2019-08-26 NOTE — Progress Notes (Signed)
PCP - Lonie Peak, PA Cardiologist - Dr. Gypsy Balsam  PPM/ICD - Denies  Chest x-ray - N/A EKG - 05/07/19 Stress Test - Denies ECHO - 05/13/19 Cardiac Cath - Denies  Sleep Study - Denies   Pt denies being diabetic.   Blood Thinner Instructions: N/A Aspirin Instructions: N/A  ERAS Protcol - No   COVID TEST- 08/26/19   Coronavirus Screening  Have you experienced the following symptoms:  Cough yes/no: No Fever (>100.43F)  yes/no: No Runny nose yes/no: No Sore throat yes/no: No Difficulty breathing/shortness of breath  yes/no: No  Have you or a family member traveled in the last 14 days and where? yes/no: No   If the patient indicates "YES" to the above questions, their PAT will be rescheduled to limit the exposure to others and, the surgeon will be notified. THE PATIENT WILL NEED TO BE ASYMPTOMATIC FOR 14 DAYS.   If the patient is not experiencing any of these symptoms, the PAT nurse will instruct them to NOT bring anyone with them to their appointment since they may have these symptoms or traveled as well.   Please remind your patients and families that hospital visitation restrictions are in effect and the importance of the restrictions.     Anesthesia review: Yes, cardiac hx; abnormal EKG 05/07/19  Patient denies shortness of breath, fever, cough and chest pain at PAT appointment   All instructions explained to the patient, with a verbal understanding of the material. Patient agrees to go over the instructions while at home for a better understanding. Patient also instructed to self quarantine after being tested for COVID-19. The opportunity to ask questions was provided.

## 2019-08-27 NOTE — Anesthesia Preprocedure Evaluation (Addendum)
Anesthesia Evaluation  Patient identified by MRN, date of birth, ID band Patient awake    Reviewed: Allergy & Precautions, NPO status , Patient's Chart, lab work & pertinent test results  History of Anesthesia Complications Negative for: history of anesthetic complications  Airway Mallampati: II  TM Distance: >3 FB Neck ROM: Full    Dental  (+) Missing,    Pulmonary neg pulmonary ROS,    Pulmonary exam normal        Cardiovascular Normal cardiovascular exam  EKG 2021: SR, incomplete RBBB   Neuro/Psych Anxiety Depression negative neurological ROS     GI/Hepatic Neg liver ROS, PUD, GERD  Medicated and Controlled,  Endo/Other  negative endocrine ROS  Renal/GU negative Renal ROS  negative genitourinary   Musculoskeletal  (+) Arthritis ,   Abdominal   Peds  Hematology negative hematology ROS (+)   Anesthesia Other Findings Day of surgery medications reviewed with patient.  Reproductive/Obstetrics negative OB ROS                             Anesthesia Physical Anesthesia Plan  ASA: II  Anesthesia Plan: Spinal   Post-op Pain Management:    Induction:   PONV Risk Score and Plan: 2 and Treatment may vary due to age or medical condition, Ondansetron, Propofol infusion, Dexamethasone and Midazolam  Airway Management Planned: Natural Airway and Simple Face Mask  Additional Equipment: None  Intra-op Plan:   Post-operative Plan:   Informed Consent: I have reviewed the patients History and Physical, chart, labs and discussed the procedure including the risks, benefits and alternatives for the proposed anesthesia with the patient or authorized representative who has indicated his/her understanding and acceptance.       Plan Discussed with: CRNA  Anesthesia Plan Comments: (Pt seen for preop eval on 05/07/19 by cardiology Dr. Agustin Cree due to reported history of WPW. Per note, Dr.  Agustin Cree was unconvinced of this diagnosis, "Wolff-Parkinson-White syndrome.  He denies having any sustained arrhythmia his EKG is not diagnostic.  I told him I doubt this diagnosis.  I will try to retrieve some previous EKG to see if any of this EKG will show some more pronounced delta wave because what I see on my EKG today is not convincing.  Again likely he does not have any sustained arrhythmias.  In the future we may put some monitor on him however for now I think the best approach is to do echocardiogram to make sure structurally his heart is normal.  He described to have some shortness of breath with exertion and again will answer this by doing echocardiogram."  Pt subsequently had echo on 05/13/19 showing normal lvef, no significant valvular pathology. He was cleared for surgery per telephone encounter 08/10/19, "Chart reviewed as part of pre-operative protocol coverage. Patient was contacted 08/10/2019 in reference to pre-operative risk assessment for pending surgery as outlined below.  Argie Applegate was last seen on 05/07/19 by Dr. Agustin Cree.  Since that day, Josuel Koeppen has done well. Echocardiogram for dyspnea was unremarkable. He can complete more than 4.0 METS without angina. Therefore, based on ACC/AHA guidelines, the patient would be at acceptable risk for the planned procedure without further cardiovascular testing."  Preop labs reviewed, WNL.  EKG 05/07/19: NSR with sinus arrhythmia. Rate 76. IRBBB.  TTE 05/13/19: 1. Left ventricular ejection fraction, by visual estimation, is 60 to  65%.There is no left ventricular hypertrophy.  2. The left ventricle has no regional  wall motion abnormalities.  3. Global right ventricle has normal systolic function.The right  ventricular size is normal. No increase in right ventricular wall  thickness.  4. Left atrial size was normal.  5. The mitral valve is normal in structure. No evidence of mitral valve  regurgitation. No evidence of  mitral stenosis.  6. The tricuspid valve is normal in structure. Tricuspid valve  regurgitation is not demonstrated.  7. The aortic valve is normal in structure. Aortic valve regurgitation is  not visualized. No evidence of aortic valve sclerosis or stenosis. )       Anesthesia Quick Evaluation

## 2019-08-27 NOTE — Progress Notes (Signed)
Anesthesia Chart Review:  Pt seen for preop eval on 05/07/19 by cardiology Dr. 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 Per note, Dr. Bing Matter was unconvinced of this diagnosis, "Wolff-Parkinson-White syndrome.  He denies having any sustained arrhythmia his EKG is not diagnostic.  I told him I doubt this diagnosis.  I will try to retrieve some previous EKG to see if any of this EKG will show some more pronounced delta wave because what I see on my EKG today is not convincing.  Again likely he does not have any sustained arrhythmias.  In the future we may put some monitor on him however for now I think the best approach is to do echocardiogram to make sure structurally his heart is normal.  He described to have some shortness of breath with exertion and again will answer this by doing echocardiogram."  Pt subsequently had echo on 05/13/19 showing normal lvef, no significant valvular pathology. He was cleared for surgery per telephone encounter 08/10/19, "Chart reviewed as part of pre-operative protocol coverage. Patient was contacted 08/10/2019 in reference to pre-operative risk assessment for pending surgery as outlined below.  Bryce Kelley was last seen on 05/07/19 by Dr. Bing Matter.  Since that day, Bryce Kelley has done well. Echocardiogram for dyspnea was unremarkable. He can complete more than 4.0 METS without angina. Therefore, based on ACC/AHA guidelines, the patient would be at acceptable risk for the planned procedure without further cardiovascular testing."  Preop labs reviewed, WNL.  EKG 05/07/19: NSR with sinus arrhythmia. Rate 76. IRBBB.  TTE 05/13/19: 1. Left ventricular ejection fraction, by visual estimation, is 60 to  65%.There is no left ventricular hypertrophy.  2. The left ventricle has no regional wall motion abnormalities.  3. Global right ventricle has normal systolic function.The right  ventricular size is normal. No increase in right ventricular wall  thickness.  4. Left  atrial size was normal.  5. The mitral valve is normal in structure. No evidence of mitral valve  regurgitation. No evidence of mitral stenosis.  6. The tricuspid valve is normal in structure. Tricuspid valve  regurgitation is not demonstrated.  7. The aortic valve is normal in structure. Aortic valve regurgitation is  not visualized. No evidence of aortic valve sclerosis or stenosis.    Bryce Kelley [facility] Short Stay Center/Anesthesiology Phone [phone] 08/27/2019 8:53 AM

## 2019-08-30 ENCOUNTER — Observation Stay (HOSPITAL_COMMUNITY): Payer: BC Managed Care – PPO

## 2019-08-30 ENCOUNTER — Other Ambulatory Visit: Payer: Self-pay

## 2019-08-30 ENCOUNTER — Encounter (HOSPITAL_COMMUNITY)
Admission: RE | Disposition: A | Discharge status: Home / Self Care | Payer: Self-pay | Source: Home / Self Care | Attending: Orthopaedic Surgery

## 2019-08-30 ENCOUNTER — Observation Stay (HOSPITAL_COMMUNITY)
Admission: RE | Admit: 2019-08-30 | Discharge: 2019-08-31 | Disposition: A | Discharge status: Home / Self Care | Payer: BC Managed Care – PPO | Attending: Orthopaedic Surgery | Admitting: Orthopaedic Surgery

## 2019-08-30 ENCOUNTER — Ambulatory Visit (HOSPITAL_COMMUNITY): Payer: BC Managed Care – PPO | Admitting: Physician Assistant

## 2019-08-30 ENCOUNTER — Ambulatory Visit (HOSPITAL_COMMUNITY): Payer: BC Managed Care – PPO | Admitting: Certified Registered Nurse Anesthetist

## 2019-08-30 ENCOUNTER — Ambulatory Visit (HOSPITAL_COMMUNITY): Payer: BC Managed Care – PPO

## 2019-08-30 ENCOUNTER — Encounter (HOSPITAL_COMMUNITY): Payer: Self-pay | Admitting: Orthopaedic Surgery

## 2019-08-30 DIAGNOSIS — Z419 Encounter for procedure for purposes other than remedying health state, unspecified: Secondary | ICD-10-CM

## 2019-08-30 DIAGNOSIS — K219 Gastro-esophageal reflux disease without esophagitis: Secondary | ICD-10-CM | POA: Insufficient documentation

## 2019-08-30 DIAGNOSIS — Z471 Aftercare following joint replacement surgery: Secondary | ICD-10-CM | POA: Diagnosis not present

## 2019-08-30 DIAGNOSIS — M1612 Unilateral primary osteoarthritis, left hip: Secondary | ICD-10-CM | POA: Diagnosis not present

## 2019-08-30 DIAGNOSIS — Z79899 Other long term (current) drug therapy: Secondary | ICD-10-CM | POA: Insufficient documentation

## 2019-08-30 DIAGNOSIS — Z09 Encounter for follow-up examination after completed treatment for conditions other than malignant neoplasm: Secondary | ICD-10-CM

## 2019-08-30 DIAGNOSIS — F329 Major depressive disorder, single episode, unspecified: Secondary | ICD-10-CM | POA: Diagnosis not present

## 2019-08-30 DIAGNOSIS — Z96642 Presence of left artificial hip joint: Secondary | ICD-10-CM | POA: Diagnosis not present

## 2019-08-30 DIAGNOSIS — F419 Anxiety disorder, unspecified: Secondary | ICD-10-CM | POA: Insufficient documentation

## 2019-08-30 DIAGNOSIS — I456 Pre-excitation syndrome: Secondary | ICD-10-CM | POA: Diagnosis not present

## 2019-08-30 DIAGNOSIS — F418 Other specified anxiety disorders: Secondary | ICD-10-CM | POA: Diagnosis not present

## 2019-08-30 HISTORY — PX: TOTAL HIP ARTHROPLASTY: SHX124

## 2019-08-30 LAB — COMPREHENSIVE METABOLIC PANEL
ALT: 25 U/L (ref 0–44)
AST: 28 U/L (ref 15–41)
Albumin: 4.1 g/dL (ref 3.5–5.0)
Alkaline Phosphatase: 74 U/L (ref 38–126)
Anion gap: 13 (ref 5–15)
BUN: 13 mg/dL (ref 6–20)
CO2: 24 mmol/L (ref 22–32)
Calcium: 9.5 mg/dL (ref 8.9–10.3)
Chloride: 105 mmol/L (ref 98–111)
Creatinine, Ser: 1.33 mg/dL — ABNORMAL HIGH (ref 0.61–1.24)
GFR calc Af Amer: >60 mL/min (ref >60)
GFR calc non Af Amer: >60 mL/min (ref >60)
Glucose, Bld: 112 mg/dL — ABNORMAL HIGH (ref 70–99)
Potassium: 3.9 mmol/L (ref 3.5–5.1)
Sodium: 142 mmol/L (ref 135–145)
Total Bilirubin: 0.8 mg/dL (ref 0.3–1.2)
Total Protein: 7 g/dL (ref 6.5–8.1)

## 2019-08-30 LAB — URINALYSIS, ROUTINE W REFLEX MICROSCOPIC
Bilirubin Urine: NEGATIVE
Glucose, UA: NEGATIVE mg/dL
Hgb urine dipstick: NEGATIVE
Ketones, ur: NEGATIVE mg/dL
Leukocytes,Ua: NEGATIVE
Nitrite: NEGATIVE
Protein, ur: NEGATIVE mg/dL
Specific Gravity, Urine: 1.012 (ref 1.005–1.030)
pH: 6 (ref 5.0–8.0)

## 2019-08-30 LAB — POCT I-STAT EG7
Acid-base deficit: 1 mmol/L (ref 0.0–2.0)
Bicarbonate: 30.4 mmol/L — ABNORMAL HIGH (ref 20.0–28.0)
Calcium, Ion: 1.33 mmol/L (ref 1.15–1.40)
HCT: 39 % (ref 39.0–52.0)
Hemoglobin: 13.3 g/dL (ref 13.0–17.0)
O2 Saturation: 99 %
Potassium: 4.2 mmol/L (ref 3.5–5.1)
Sodium: 140 mmol/L (ref 135–145)
TCO2: 33 mmol/L — ABNORMAL HIGH (ref 22–32)
pCO2, Ven: 88.5 mmHg (ref 44.0–60.0)
pH, Ven: 7.144 — CL (ref 7.250–7.430)
pO2, Ven: 158 mmHg — ABNORMAL HIGH (ref 32.0–45.0)

## 2019-08-30 SURGERY — ARTHROPLASTY, HIP, TOTAL, ANTERIOR APPROACH
Anesthesia: Spinal | Site: Hip | Laterality: Left

## 2019-08-30 MED ORDER — LACTATED RINGERS IV SOLN: Freq: Once | INTRAVENOUS | Status: AC

## 2019-08-30 MED ORDER — FENTANYL CITRATE (PF) 100 MCG/2ML IJ SOLN
INTRAMUSCULAR | Status: DC | PRN
  Administered 2019-08-30: 25 ug via INTRAVENOUS

## 2019-08-30 MED ORDER — HYDROMORPHONE HCL 1 MG/ML IJ SOLN: 0.2500 mg | INTRAMUSCULAR | Status: DC | PRN

## 2019-08-30 MED ORDER — BUPIVACAINE HCL 0.5 % IJ SOLN
INTRAMUSCULAR | Status: DC | PRN
  Administered 2019-08-30: 20 mL

## 2019-08-30 MED ORDER — OXYCODONE HCL 5 MG PO TABS
5.0000 mg | ORAL_TABLET | ORAL | Status: DC | PRN
  Administered 2019-08-30 – 2019-08-31 (×2): 10 mg via ORAL
  Administered 2019-08-31 (×2): 5 mg via ORAL
  Filled 2019-08-30: qty 2
  Filled 2019-08-30: qty 1
  Filled 2019-08-30 (×2): qty 2

## 2019-08-30 MED ORDER — ALPRAZOLAM 0.25 MG PO TABS: 0.2500 mg | ORAL_TABLET | Freq: Every day | ORAL | Status: DC | PRN

## 2019-08-30 MED ORDER — DOCUSATE SODIUM 100 MG PO CAPS
100.0000 mg | ORAL_CAPSULE | Freq: Two times a day (BID) | ORAL | Status: DC
  Administered 2019-08-30 – 2019-08-31 (×2): 100 mg via ORAL
  Filled 2019-08-30 (×2): qty 1

## 2019-08-30 MED ORDER — ASPIRIN EC 325 MG PO TBEC
325.0000 mg | DELAYED_RELEASE_TABLET | Freq: Every day | ORAL | Status: DC
  Administered 2019-08-31: 09:00:00 325 mg via ORAL
  Filled 2019-08-30: qty 1

## 2019-08-30 MED ORDER — PROPOFOL 500 MG/50ML IV EMUL
INTRAVENOUS | Status: DC | PRN
  Administered 2019-08-30: 25 ug/kg/min via INTRAVENOUS

## 2019-08-30 MED ORDER — PROMETHAZINE HCL 25 MG/ML IJ SOLN: 6.2500 mg | INTRAMUSCULAR | Status: DC | PRN

## 2019-08-30 MED ORDER — FENTANYL CITRATE (PF) 250 MCG/5ML IJ SOLN
INTRAMUSCULAR | Status: AC
  Filled 2019-08-30: qty 5

## 2019-08-30 MED ORDER — BUPIVACAINE LIPOSOME 1.3 % IJ SUSP
20.0000 mL | Freq: Once | INTRAMUSCULAR | Status: AC
  Administered 2019-08-30: 20 mL
  Filled 2019-08-30: qty 20

## 2019-08-30 MED ORDER — OXYCODONE HCL 5 MG PO TABS
5.0000 mg | ORAL_TABLET | Freq: Once | ORAL | Status: AC | PRN
  Administered 2019-08-30: 5 mg via ORAL

## 2019-08-30 MED ORDER — OXYCODONE HCL 5 MG PO TABS
ORAL_TABLET | ORAL | Status: AC
  Administered 2019-08-30: 20:00:00 5 mg
  Filled 2019-08-30: qty 1

## 2019-08-30 MED ORDER — CHLORHEXIDINE GLUCONATE 0.12 % MT SOLN
OROMUCOSAL | Status: AC
  Administered 2019-08-30: 15 mL via OROMUCOSAL
  Filled 2019-08-30: qty 15

## 2019-08-30 MED ORDER — ORAL CARE MOUTH RINSE: 15.0000 mL | Freq: Once | OROMUCOSAL | Status: AC

## 2019-08-30 MED ORDER — METHOCARBAMOL 1000 MG/10ML IJ SOLN
500.0000 mg | Freq: Four times a day (QID) | INTRAVENOUS | Status: DC | PRN
  Filled 2019-08-30: qty 5

## 2019-08-30 MED ORDER — SODIUM CHLORIDE 0.9 % IR SOLN
Status: DC | PRN
  Administered 2019-08-30: 1000 mL

## 2019-08-30 MED ORDER — MIDAZOLAM HCL 2 MG/2ML IJ SOLN
INTRAMUSCULAR | Status: AC
  Filled 2019-08-30: qty 2

## 2019-08-30 MED ORDER — POLYETHYLENE GLYCOL 3350 17 G PO PACK: 17.0000 g | PACK | Freq: Every day | ORAL | Status: DC | PRN

## 2019-08-30 MED ORDER — MENTHOL 3 MG MT LOZG: 1.0000 | LOZENGE | OROMUCOSAL | Status: DC | PRN

## 2019-08-30 MED ORDER — ONDANSETRON HCL 4 MG PO TABS: 4.0000 mg | ORAL_TABLET | Freq: Four times a day (QID) | ORAL | Status: DC | PRN

## 2019-08-30 MED ORDER — LORATADINE 10 MG PO TABS
10.0000 mg | ORAL_TABLET | Freq: Every day | ORAL | Status: DC
  Administered 2019-08-31: 10 mg via ORAL
  Filled 2019-08-30: qty 1

## 2019-08-30 MED ORDER — ONDANSETRON HCL 4 MG/2ML IJ SOLN
INTRAMUSCULAR | Status: AC
  Filled 2019-08-30: qty 2

## 2019-08-30 MED ORDER — PHENOL 1.4 % MT LIQD: 1.0000 | OROMUCOSAL | Status: DC | PRN

## 2019-08-30 MED ORDER — SODIUM CHLORIDE 0.9 % IV SOLN: INTRAVENOUS | Status: DC

## 2019-08-30 MED ORDER — ALBUMIN HUMAN 5 % IV SOLN: INTRAVENOUS | Status: DC | PRN

## 2019-08-30 MED ORDER — LACTATED RINGERS IV SOLN
INTRAVENOUS | Status: DC | PRN
Start: 2019-08-30 — End: 2019-08-30

## 2019-08-30 MED ORDER — METOCLOPRAMIDE HCL 5 MG/ML IJ SOLN: 5.0000 mg | Freq: Three times a day (TID) | INTRAMUSCULAR | Status: DC | PRN

## 2019-08-30 MED ORDER — MIDAZOLAM HCL 5 MG/5ML IJ SOLN
INTRAMUSCULAR | Status: DC | PRN
  Administered 2019-08-30: 2 mg via INTRAVENOUS
  Administered 2019-08-30 (×2): 1 mg via INTRAVENOUS

## 2019-08-30 MED ORDER — DEXAMETHASONE SODIUM PHOSPHATE 10 MG/ML IJ SOLN
INTRAMUSCULAR | Status: AC
  Filled 2019-08-30: qty 1

## 2019-08-30 MED ORDER — VANCOMYCIN HCL 1500 MG/300ML IV SOLN
1500.0000 mg | INTRAVENOUS | Status: AC
  Administered 2019-08-30: 1500 mg via INTRAVENOUS
  Filled 2019-08-30: qty 300

## 2019-08-30 MED ORDER — HYDROMORPHONE HCL 1 MG/ML IJ SOLN
0.5000 mg | INTRAMUSCULAR | Status: DC | PRN
  Administered 2019-08-30: 1 mg via INTRAVENOUS
  Filled 2019-08-30: qty 1

## 2019-08-30 MED ORDER — PANTOPRAZOLE SODIUM 40 MG PO TBEC
40.0000 mg | DELAYED_RELEASE_TABLET | Freq: Every day | ORAL | Status: DC
  Administered 2019-08-31: 09:00:00 40 mg via ORAL
  Filled 2019-08-30: qty 1

## 2019-08-30 MED ORDER — ACETAMINOPHEN 325 MG PO TABS
325.0000 mg | ORAL_TABLET | Freq: Four times a day (QID) | ORAL | Status: DC | PRN
  Administered 2019-08-31 (×2): 650 mg via ORAL
  Filled 2019-08-30 (×2): qty 2

## 2019-08-30 MED ORDER — MONTELUKAST SODIUM 10 MG PO TABS
10.0000 mg | ORAL_TABLET | Freq: Every day | ORAL | Status: DC
  Administered 2019-08-31: 10 mg via ORAL
  Filled 2019-08-30: qty 1

## 2019-08-30 MED ORDER — PHENYLEPHRINE 40 MCG/ML (10ML) SYRINGE FOR IV PUSH (FOR BLOOD PRESSURE SUPPORT)
PREFILLED_SYRINGE | INTRAVENOUS | Status: AC
  Filled 2019-08-30: qty 10

## 2019-08-30 MED ORDER — BUPIVACAINE HCL (PF) 0.5 % IJ SOLN
INTRAMUSCULAR | Status: AC
  Filled 2019-08-30: qty 30

## 2019-08-30 MED ORDER — ACETAMINOPHEN 500 MG PO TABS: 1000.0000 mg | ORAL_TABLET | Freq: Once | ORAL | Status: DC

## 2019-08-30 MED ORDER — BUPIVACAINE HCL (PF) 0.75 % IJ SOLN
INTRAMUSCULAR | Status: DC | PRN
  Administered 2019-08-30: 1.9 mL

## 2019-08-30 MED ORDER — METHOCARBAMOL 500 MG PO TABS
500.0000 mg | ORAL_TABLET | Freq: Four times a day (QID) | ORAL | Status: DC | PRN
  Administered 2019-08-30 – 2019-08-31 (×3): 500 mg via ORAL
  Filled 2019-08-30 (×3): qty 1

## 2019-08-30 MED ORDER — METOCLOPRAMIDE HCL 5 MG PO TABS: 5.0000 mg | ORAL_TABLET | Freq: Three times a day (TID) | ORAL | Status: DC | PRN

## 2019-08-30 MED ORDER — ONDANSETRON HCL 4 MG/2ML IJ SOLN: 4.0000 mg | Freq: Four times a day (QID) | INTRAMUSCULAR | Status: DC | PRN

## 2019-08-30 MED ORDER — OXYCODONE HCL 5 MG/5ML PO SOLN: 5.0000 mg | Freq: Once | ORAL | Status: AC | PRN

## 2019-08-30 MED ORDER — CHLORHEXIDINE GLUCONATE 0.12 % MT SOLN: 15.0000 mL | Freq: Once | OROMUCOSAL | Status: AC

## 2019-08-30 SURGICAL SUPPLY — 46 items
BLADE CLIPPER SURG (BLADE) ×2 IMPLANT
BLADE SAW SGTL 18X1.27X75 (BLADE) ×2 IMPLANT
BLADE SAW SGTL 18X1.27X75MM (BLADE) ×1
CELLS DAT CNTRL 66122 CELL SVR (MISCELLANEOUS) ×1 IMPLANT
COVER SURGICAL LIGHT HANDLE (MISCELLANEOUS) ×3 IMPLANT
CUP SECTOR GRIPTON 58MM (Orthopedic Implant) ×2 IMPLANT
DRAPE C-ARM 42X72 X-RAY (DRAPES) ×3 IMPLANT
DRAPE IMP U-DRAPE 54X76 (DRAPES) ×3 IMPLANT
DRAPE STERI IOBAN 125X83 (DRAPES) ×3 IMPLANT
DRAPE U-SHAPE 47X51 STRL (DRAPES) ×9 IMPLANT
DRSG MEPILEX BORDER 4X8 (GAUZE/BANDAGES/DRESSINGS) ×2 IMPLANT
DURAPREP 26ML APPLICATOR (WOUND CARE) ×3 IMPLANT
ELECT CAUTERY BLADE 6.4 (BLADE) ×3 IMPLANT
ELECT REM PT RETURN 9FT ADLT (ELECTROSURGICAL) ×3
ELECTRODE REM PT RTRN 9FT ADLT (ELECTROSURGICAL) ×1 IMPLANT
ELIMINATOR HOLE APEX DEPUY (Hips) ×2 IMPLANT
FACESHIELD WRAPAROUND (MASK) ×6 IMPLANT
FACESHIELD WRAPAROUND OR TEAM (MASK) ×2 IMPLANT
GLOVE BIOGEL PI IND STRL 8 (GLOVE) ×2 IMPLANT
GLOVE BIOGEL PI INDICATOR 8 (GLOVE) ×4
GLOVE ORTHO TXT STRL SZ7.5 (GLOVE) ×6 IMPLANT
GOWN STRL REUS W/ TWL LRG LVL3 (GOWN DISPOSABLE) ×1 IMPLANT
GOWN STRL REUS W/ TWL XL LVL3 (GOWN DISPOSABLE) ×1 IMPLANT
GOWN STRL REUS W/TWL 2XL LVL3 (GOWN DISPOSABLE) ×3 IMPLANT
GOWN STRL REUS W/TWL LRG LVL3 (GOWN DISPOSABLE) ×3
GOWN STRL REUS W/TWL XL LVL3 (GOWN DISPOSABLE) ×3
HEAD CERAMIC DELTA 36 PLUS 1.5 (Hips) ×2 IMPLANT
KIT BASIN OR (CUSTOM PROCEDURE TRAY) ×3 IMPLANT
KIT TURNOVER KIT B (KITS) ×3 IMPLANT
LINER NEUTRAL 36X58 PLUS4 ×2 IMPLANT
MANIFOLD NEPTUNE II (INSTRUMENTS) ×3 IMPLANT
NS IRRIG 1000ML POUR BTL (IV SOLUTION) ×3 IMPLANT
PACK TOTAL JOINT (CUSTOM PROCEDURE TRAY) ×3 IMPLANT
PAD ARMBOARD 7.5X6 YLW CONV (MISCELLANEOUS) ×6 IMPLANT
RETRACTOR WND ALEXIS 18 MED (MISCELLANEOUS) ×1 IMPLANT
RTRCTR WOUND ALEXIS 18CM MED (MISCELLANEOUS) ×3
STEM FEMORAL SZ 5MM STD ACTIS (Stem) ×2 IMPLANT
SUT VIC AB 0 CT1 27 (SUTURE) ×3
SUT VIC AB 0 CT1 27XBRD ANBCTR (SUTURE) ×1 IMPLANT
SUT VIC AB 2-0 CT1 27 (SUTURE) ×3
SUT VIC AB 2-0 CT1 TAPERPNT 27 (SUTURE) ×1 IMPLANT
SUT VICRYL 4-0 PS2 18IN ABS (SUTURE) ×3 IMPLANT
SUT VLOC 180 0 24IN GS25 (SUTURE) ×3 IMPLANT
TOWEL GREEN STERILE (TOWEL DISPOSABLE) ×6 IMPLANT
TOWEL GREEN STERILE FF (TOWEL DISPOSABLE) ×3 IMPLANT
TRAY FOLEY W/BAG SLVR 14FR (SET/KITS/TRAYS/PACK) ×2 IMPLANT

## 2019-08-30 NOTE — Progress Notes (Signed)
Orthopedic Tech Progress Note Patient Details:  Bryce Kelley 1971/03/22 [id number] Applied overhead frame with trapeze.  Patient ID: Bryce Kelley, male   DOB: 11-24-1970, 49 y.o.   MRN: [id number]   [name] 08/30/2019, 9:31 PM

## 2019-08-30 NOTE — Interval H&P Note (Signed)
History and Physical Interval Note:  08/30/2019 2:47 PM  Bryce Kelley  has presented today for surgery, with the diagnosis of left hip osteoarthritis.  The various methods of treatment have been discussed with the patient and family. After consideration of risks, benefits and other options for treatment, the patient has consented to  Procedure(s): LEFT TOTAL HIP ARTHROPLASTY ANTERIOR APPROACH (Left) as a surgical intervention.  The patient's history has been reviewed, patient examined, no change in status, stable for surgery.  I have reviewed the patient's chart and labs.  Questions were answered to the patient's satisfaction.     Eldred Manges

## 2019-08-30 NOTE — Transfer of Care (Signed)
Immediate Anesthesia Transfer of Care Note  Patient: Bryce Kelley  Procedure(s) Performed: LEFT TOTAL HIP ARTHROPLASTY ANTERIOR APPROACH (Left Hip)  Patient Location: PACU  Anesthesia Type:MAC and Spinal  Level of Consciousness: drowsy and patient cooperative  Airway & Oxygen Therapy: Patient Spontanous Breathing  Post-op Assessment: Report given to RN and Post -op Vital signs reviewed and stable  Post vital signs: Reviewed and stable  Last Vitals:  Vitals Value Taken Time  BP    Temp    Pulse 101 08/30/19 1841  Resp 11 08/30/19 1841  SpO2 100 % 08/30/19 1841  Vitals shown include unvalidated device data.  Last Pain:  Vitals:   08/30/19 1359  TempSrc:   PainSc: 0-No pain      Patients Stated Pain Goal: 3 (72/55/00 1642)  Complications: No apparent anesthesia complications

## 2019-08-30 NOTE — Op Note (Addendum)
Preop diagnosis: Left hip primary osteoarthritis  Postop diagnosis: Same  Procedure: Left total hip arthroplasty, direct anterior approach.  Surgeon Rodell Perna, MD  Assistant: Benjiman Core, PA-C medically necessary and present until prosthesis was implanted.  Second assist Laure Kidney, RNFA  Anesthesia: Spinal +10 cc Exparel 10 cc Marcaine.  EBL: Approximately 1269ml  Implants:depuy Gripton acetabular cup 58 mm.  Apex eliminator.  +4 neutral liner 36 x 58 mm.  Size 5 standard Actis stem with +5 ceramic ball.  Procedure: After induction of spinal anesthesia Hana boots Foley catheter placement on the Hana table abdomen was taped across skin clipper was used and 1015 drapes were applied above and below.  C-arm was brought in good visualization of both the left hip as well as both trochanters for leg length measurement Intra-Op.  Standard prepping with DuraPrep the usual hip sheets drapes large shower curtain Betadine Steri-Drape was applied after sterile skin marker.  Timeout procedure completed and preop antibiotics given.  Incision was made direct anterior starting 1 fingerbreadth lateral 1 inferior to the ASIS obliquely down over the trochanter.  Subtenons tissue was elevated skin protector was placed was then for period of time and kept falling out and then was removed.  Fascia was nicked extended and dull cobra was placed over the top of the capsule.  Transverse artery was coagulated and anterior capsule was opened clear joint fluid was noted in the anterior capsule was resected.  C arm was cut with C arm visualization.  Trochanter was completed with osteotome but I left significantly more trochanter more than normal and later during the case we had to come back and resect some of the posterior trochanter since taking the hip down and under he was catch on the posterior acetabulum.  Head was removed after cutting the neck and had been removed in pieces since it could not rotate due to deformity  of the head marginal osteophytes and loss of joint space with erosion.  Head was cut into pieces using the osteotome and once it was completely removed we sequentially progressed up to 57 reamer for a 58 acetabulum.  Cup was inserted was extremely tight fit no screws were needed and the apex hole in the air was inserted.  C arm was used for final Seating to make sure was at the appropriate cup flexion and Abduction in the safe zone.  With +4 neutral liner dialed and impacted checked secure hydraulic arm was attached hip was taken down.  Several times had to bring him back up even though it actually rotate to 120 the trochanter which was large was impinging posteriorly not allowing the femur to slide laterally far enough to expose the canal.  We continue to work on lateralization with a cookie cutter curettes Leksell rongeur using the large trochanteric clamp and resecting a portion of the posterior spur and then the posterior capsule which was hidden behind it and this finally allowed the femur to float over enough to expose the canal for appropriate standard entry.  We progressed up to a #5 broach which gave an excellent tight fit fill the canal nicely and with anterior posterior fit I did not think I could get a #610.  After removal of the posterior aspect of the trochanter and the posterior capsule there was some posterior bleeding and we lost 300 cc working on trying to coagulate the venous bleeders with care of the location of sciatic nerve posteriorly as well.  Long bipolar cautery for taken off the  spine set and used as well.  Bleeding was contained stopped operative field was dry and then we progressed with broaching of the canal trialing and then finally placed in the #5 with +1.5 ball ceramic due to the patient's age at 28.  There was good stability trace shuck hip could be extended 45 degrees externally rotated to 90 degrees and the ball stayed in.  Leg lengths were equal on x-ray.  Final time would look  for bleeders and everything was dry.  Copious irrigation V lock closure of fascia ~subtenons tissue skin staple closure postop dressing after infiltration Marcaine Exparel and transferred recovery room.  Instrument count needle count was correct.

## 2019-08-30 NOTE — H&P (Signed)
TOTAL HIP ADMISSION H&P  Patient is admitted for left total hip arthroplasty.  Subjective:  Chief Complaint: left hip pain  49 year old male history of end-stage DJD left hip and pain comes in for preop evaluation.  Symptoms unchanged from previous visit and he is want to proceed with left total hip replacement as scheduled.  Today history and physical performed.  Surgical procedure discussed.  All questions answered.  Patient was seen by cardiology for clearance and I need to make sure that we have that.   HPI: Louie Flenner, 49 y.o. male, has a history of pain and functional disability in the left hip(s) due to arthritis and patient has failed non-surgical conservative treatments for greater than 12 weeks to include NSAID's and/or analgesics, use of assistive devices, weight reduction as appropriate and activity modification.  Onset of symptoms was gradual starting 10 years ago with gradually worsening course since that time.The patient noted no past surgery on the left hip(s).  Patient currently rates pain in the left hip at 10 out of 10 with activity. Patient has night pain, worsening of pain with activity and weight bearing, trendelenberg gait, pain that interfers with activities of daily living and crepitus. Patient has evidence of subchondral cysts, subchondral sclerosis, periarticular osteophytes and joint space narrowing by imaging studies. This condition presents safety issues increasing the risk of falls.   There is no current active infection.  Patient Active Problem List   Diagnosis Date Noted  . Dyspnea on exertion 05/07/2019  . GERD (gastroesophageal reflux disease) 05/07/2019  . Unilateral primary osteoarthritis, left hip 03/30/2019   Past Medical History:  Diagnosis Date  . Acid reflux   . Anxiety   . Arthritis   . Depression   . Gastric ulcer   . Thyroid disease   . Wolff-Parkinson-White syndrome     Past Surgical History:  Procedure Laterality Date  . TONSILLECTOMY       No current facility-administered medications for this encounter.   Current Outpatient Medications  Medication Sig Dispense Refill Last Dose  . ALPRAZolam (XANAX) 0.25 MG tablet Take 0.25 mg by mouth daily as needed for anxiety.      . Ascorbic Acid (VITAMIN C PO) Take 1,600 mg by mouth daily.     . Calcium Citrate (CITRACAL PO) Take 1 tablet by mouth every other day.     . diphenhydramine-acetaminophen (TYLENOL PM) 25-500 MG TABS tablet Take 1 tablet by mouth at bedtime as needed (sleep).     . GARLIC PO Take 8,657 mg by mouth daily.     Marland Kitchen loratadine (CLARITIN) 10 MG tablet Take 10 mg by mouth daily.     . montelukast (SINGULAIR) 10 MG tablet Take 10 mg by mouth daily.     . Multiple Vitamin (MULTIVITAMIN WITH MINERALS) TABS tablet Take 1 tablet by mouth daily.     . Multiple Vitamins-Minerals (ICAPS AREDS 2 PO) Take 1 capsule by mouth 2 (two) times daily.     . Naproxen Sod-diphenhydrAMINE (ALEVE PM) 220-25 MG TABS Take 1 tablet by mouth at bedtime as needed (sleep).     . Omega-3 Fatty Acids (OMEGA 3 500) 500 MG CAPS Take 500 mg by mouth daily.     Marland Kitchen omeprazole (PRILOSEC) 20 MG capsule Take 20 mg by mouth daily.   5   . OVER THE COUNTER MEDICATION Take 1 tablet by mouth daily. Immunety otc supplment     . Phenylephrine-Acetaminophen (TYLENOL SINUS+HEADACHE PO) Take 2 tablets by mouth at bedtime as needed (  congestion).      Allergies  Allergen Reactions  . Motrin [Ibuprofen] Swelling    Tongue swelling  . Amoxicillin Rash    Social History   Tobacco Use  . Smoking status: Never Smoker  . Smokeless tobacco: Never Used  Substance Use Topics  . Alcohol use: Not Currently    Family History  Problem Relation Age of Onset  . Heart attack Father      Review of Systems  Constitutional: Negative.   HENT: Negative.   Respiratory: Negative.   Cardiovascular: Negative.   Gastrointestinal: Negative.   Genitourinary: Negative.   Musculoskeletal: Positive for gait problem.   Psychiatric/Behavioral: Negative.     Objective:  Physical Exam  Constitutional: He is oriented to person, place, and time. He appears well-developed.  HENT:  Head: Normocephalic and atraumatic.  Eyes: Pupils are equal, round, and reactive to light. EOM are normal.  Cardiovascular: Normal heart sounds.  Respiratory: No respiratory distress. He has no wheezes.  GI: Bowel sounds are normal. He exhibits no distension. There is no abdominal tenderness.  Musculoskeletal:        General: Tenderness present.     Cervical back: Normal range of motion.  Neurological: He is alert and oriented to person, place, and time.  Skin: Skin is warm and dry.  Psychiatric: He has a normal mood and affect.    Vital signs in last 24 hours:    Labs:   Estimated body mass index is 31.66 kg/m as calculated from the following:   Height as of 08/26/19: 6\' 3"  (1.905 m).   Weight as of 08/26/19: 114.9 kg.   Imaging Review Plain radiographs demonstrate moderate degenerative joint disease of the left hip(s). The bone quality appears to be good for age and reported activity level.      Assessment/Plan:  End stage arthritis, left hip(s)  The patient history, physical examination, clinical judgement of the provider and imaging studies are consistent with end stage degenerative joint disease of the left hip(s) and total hip arthroplasty is deemed medically necessary. The treatment options including medical management, injection therapy, arthroscopy and arthroplasty were discussed at length. The risks and benefits of total hip arthroplasty were presented and reviewed. The risks due to aseptic loosening, infection, stiffness, dislocation/subluxation,  thromboembolic complications and other imponderables were discussed.  The patient acknowledged the explanation, agreed to proceed with the plan and consent was signed. Patient is being admitted for inpatient treatment for surgery, pain control, PT, OT, prophylactic  antibiotics, VTE prophylaxis, progressive ambulation and ADL's and discharge planning.The patient is planning to be discharged home with home health services

## 2019-08-30 NOTE — Anesthesia Procedure Notes (Signed)
Spinal  Patient location during procedure: OR Start time: 08/30/2019 3:36 PM End time: 08/30/2019 3:38 PM Staffing Performed: anesthesiologist  Anesthesiologist: Kaylyn Layer, MD Preanesthetic Checklist Completed: patient identified, IV checked, risks and benefits discussed, surgical consent, monitors and equipment checked, pre-op evaluation and timeout performed Spinal Block Patient position: sitting Prep: DuraPrep and site prepped and draped Patient monitoring: continuous pulse ox, blood pressure and heart rate Approach: midline Location: L3-4 Injection technique: single-shot Needle Needle type: Pencan  Needle gauge: 24 G Needle length: 9 cm Additional Notes Risks, benefits, and alternative discussed. Patient gave consent to procedure. Prepped and draped in sitting position. Patient sedated but responsive to voice. Clear CSF obtained after one needle pass. Positive terminal aspiration. No pain or paraesthesias with injection. Patient tolerated procedure well. Vital signs stable. Amalia Greenhouse, MD

## 2019-08-30 NOTE — Anesthesia Postprocedure Evaluation (Signed)
Anesthesia Post Note  Patient: Bryce Kelley  Procedure(s) Performed: LEFT TOTAL HIP ARTHROPLASTY ANTERIOR APPROACH (Left Hip)     Patient location during evaluation: PACU Anesthesia Type: Spinal and MAC Level of consciousness: awake and alert Pain management: pain level controlled Vital Signs Assessment: post-procedure vital signs reviewed and stable Respiratory status: spontaneous breathing, nonlabored ventilation, respiratory function stable and patient connected to nasal cannula oxygen Cardiovascular status: stable and blood pressure returned to baseline Postop Assessment: no apparent nausea or vomiting and spinal receding Anesthetic complications: no    Last Vitals:  Vitals:   08/30/19 1914 08/30/19 1935  BP: 120/77 121/88  Pulse: 94 84  Resp: 14 17  Temp: 36.6 C (!) 36.1 C  SpO2: 100% 100%    Last Pain:  Vitals:   08/30/19 1922  TempSrc:   PainSc: 4                  Vester Balthazor

## 2019-08-30 NOTE — Progress Notes (Signed)
Spoke with Dr. Ophelia Charter regarding pt complaints of the last 3 days of intermittent L flank pain at home. Pain relieved after taking Xanax and Tylenol.  Pt unsure whether it was gas pain but did experience relief after BM and belching.  Pt denies any difficultly with urination, no foul odors, no burning, no fevers at home.  Denies any pain with percussion to L flank.  Awaiting UA and CMP results.

## 2019-08-31 ENCOUNTER — Encounter: Payer: Self-pay | Admitting: *Deleted

## 2019-08-31 DIAGNOSIS — K219 Gastro-esophageal reflux disease without esophagitis: Secondary | ICD-10-CM | POA: Diagnosis not present

## 2019-08-31 DIAGNOSIS — Z79899 Other long term (current) drug therapy: Secondary | ICD-10-CM | POA: Diagnosis not present

## 2019-08-31 DIAGNOSIS — F329 Major depressive disorder, single episode, unspecified: Secondary | ICD-10-CM | POA: Diagnosis not present

## 2019-08-31 DIAGNOSIS — F419 Anxiety disorder, unspecified: Secondary | ICD-10-CM | POA: Diagnosis not present

## 2019-08-31 DIAGNOSIS — I456 Pre-excitation syndrome: Secondary | ICD-10-CM | POA: Diagnosis not present

## 2019-08-31 DIAGNOSIS — M1612 Unilateral primary osteoarthritis, left hip: Secondary | ICD-10-CM | POA: Diagnosis not present

## 2019-08-31 LAB — BASIC METABOLIC PANEL
Anion gap: 15 (ref 5–15)
BUN: 18 mg/dL (ref 6–20)
CO2: 21 mmol/L — ABNORMAL LOW (ref 22–32)
Calcium: 8.5 mg/dL — ABNORMAL LOW (ref 8.9–10.3)
Chloride: 102 mmol/L (ref 98–111)
Creatinine, Ser: 1.69 mg/dL — ABNORMAL HIGH (ref 0.61–1.24)
GFR calc Af Amer: 54 mL/min — ABNORMAL LOW (ref >60)
GFR calc non Af Amer: 47 mL/min — ABNORMAL LOW (ref >60)
Glucose, Bld: 149 mg/dL — ABNORMAL HIGH (ref 70–99)
Potassium: 4.6 mmol/L (ref 3.5–5.1)
Sodium: 138 mmol/L (ref 135–145)

## 2019-08-31 LAB — CBC
HCT: 31 % — ABNORMAL LOW (ref 39.0–52.0)
Hemoglobin: 10.1 g/dL — ABNORMAL LOW (ref 13.0–17.0)
MCH: 30.3 pg (ref 26.0–34.0)
MCHC: 32.6 g/dL (ref 30.0–36.0)
MCV: 93.1 fL (ref 80.0–100.0)
Platelets: 159 10*3/uL (ref 150–400)
RBC: 3.33 MIL/uL — ABNORMAL LOW (ref 4.22–5.81)
RDW: 13.6 % (ref 11.5–15.5)
WBC: 10.9 10*3/uL — ABNORMAL HIGH (ref 4.0–10.5)
nRBC: 0 % (ref 0.0–0.2)

## 2019-08-31 LAB — GLUCOSE, CAPILLARY: Glucose-Capillary: 139 mg/dL — ABNORMAL HIGH (ref 70–99)

## 2019-08-31 MED ORDER — ASPIRIN 325 MG PO TBEC: 325.0000 mg | DELAYED_RELEASE_TABLET | Freq: Every day | ORAL | 0 refills | Status: DC

## 2019-08-31 MED ORDER — HYDROCODONE-ACETAMINOPHEN 5-325 MG PO TABS
1.0000 | ORAL_TABLET | Freq: Once | ORAL | Status: AC
  Administered 2019-08-31: 14:00:00 1 via ORAL
  Filled 2019-08-31: qty 1

## 2019-08-31 MED ORDER — METHOCARBAMOL 500 MG PO TABS: 500.0000 mg | ORAL_TABLET | Freq: Four times a day (QID) | ORAL | 1 refills | Status: DC | PRN

## 2019-08-31 MED ORDER — OXYCODONE-ACETAMINOPHEN 5-325 MG PO TABS: 1.0000 | ORAL_TABLET | Freq: Four times a day (QID) | ORAL | 0 refills | Status: DC | PRN

## 2019-08-31 MED ORDER — TAMSULOSIN HCL 0.4 MG PO CAPS
0.8000 mg | ORAL_CAPSULE | Freq: Once | ORAL | Status: AC
  Administered 2019-08-31: 10:00:00 0.8 mg via ORAL
  Filled 2019-08-31: qty 2

## 2019-08-31 MED ORDER — MENTHOL 3 MG MT LOZG: 1.0000 | LOZENGE | OROMUCOSAL | 12 refills | Status: DC | PRN

## 2019-08-31 MED ORDER — ALPRAZOLAM 0.25 MG PO TABS
0.2500 mg | ORAL_TABLET | Freq: Every day | ORAL | Status: DC | PRN
  Administered 2019-08-31: 10:00:00 0.25 mg via ORAL
  Filled 2019-08-31: qty 1

## 2019-08-31 NOTE — Discharge Instructions (Signed)

## 2019-08-31 NOTE — Progress Notes (Signed)
   Subjective: 1 Day Post-Op Procedure(s) (LRB): LEFT TOTAL HIP ARTHROPLASTY ANTERIOR APPROACH (Left) Patient reports pain as mild and moderate.    Objective: Vital signs in last 24 hours: Temp:  [97 F (36.1 C)-99.1 F (37.3 C)] 99.1 F (37.3 C) (05/11 0712) Pulse Rate:  [84-126] 98 (05/11 0712) Resp:  [13-20] 17 (05/11 0712) BP: (101-160)/(61-101) 145/91 (05/11 0712) SpO2:  [96 %-100 %] 98 % (05/11 0712) Weight:  [113.4 kg] 113.4 kg (05/10 1327)  Intake/Output from previous day: 05/10 0701 - 05/11 0700 In: 2000 [I.V.:1500; IV Piggyback:500] Out: 1675 [Urine:425; Blood:1250] Intake/Output this shift: No intake/output data recorded.  Recent Labs    08/30/19 1804  HGB 13.3   Recent Labs    08/30/19 1804  HCT 39.0   Recent Labs    08/30/19 1328 08/30/19 1804  NA 142 140  K 3.9 4.2  CL 105  --   CO2 24  --   BUN 13  --   CREATININE 1.33*  --   GLUCOSE 112*  --   CALCIUM 9.5  --    No results for input(s): LABPT, INR in the last 72 hours.  Neurologically intact DG C-Arm 1-60 Min  Result Date: 08/30/2019 CLINICAL DATA:  Left hip arthroplasty EXAM: OPERATIVE left HIP (WITH PELVIS IF PERFORMED) 2 VIEWS TECHNIQUE: Fluoroscopic spot image(s) were submitted for interpretation post-operatively. COMPARISON:  03/30/2019 FINDINGS: Two low resolution intraoperative spot views of the left hip. Total fluoroscopy time was 32.3 seconds. The images demonstrate a left hip replacement with normal alignment IMPRESSION: Intraoperative fluoroscopic assistance provided during left hip surgery Electronically Signed   By: Jasmine Pang M.D.   On: 08/30/2019 18:45   DG Hip Port Unilat With Pelvis 1V Left  Result Date: 08/30/2019 CLINICAL DATA:  Left hip arthroplasty EXAM: DG HIP (WITH OR WITHOUT PELVIS) 1V PORT LEFT COMPARISON:  03/30/2019 FINDINGS: Frontal view of the pelvis as well as cross-table lateral view of the left hip demonstrates left hip arthroplasty in the expected position  without signs of acute complication. Postsurgical changes in the overlying soft tissues. IMPRESSION: 1. Unremarkable left hip arthroplasty. Electronically Signed   By: Sharlet Salina M.D.   On: 08/30/2019 18:57   DG HIP OPERATIVE UNILAT W OR W/O PELVIS LEFT  Result Date: 08/30/2019 CLINICAL DATA:  Left hip arthroplasty EXAM: OPERATIVE left HIP (WITH PELVIS IF PERFORMED) 2 VIEWS TECHNIQUE: Fluoroscopic spot image(s) were submitted for interpretation post-operatively. COMPARISON:  03/30/2019 FINDINGS: Two low resolution intraoperative spot views of the left hip. Total fluoroscopy time was 32.3 seconds. The images demonstrate a left hip replacement with normal alignment IMPRESSION: Intraoperative fluoroscopic assistance provided during left hip surgery Electronically Signed   By: Jasmine Pang M.D.   On: 08/30/2019 18:45    Assessment/Plan: 1 Day Post-Op Procedure(s) (LRB): LEFT TOTAL HIP ARTHROPLASTY ANTERIOR APPROACH (Left) Up with therapy, CBC pending. No orthostatic problems when up walking. Venous I-STAT in pacu showed Hgb 13.9 pending CBC this AM. If good and ambulatory with PT he wants to go home.   Bryce Kelley 08/31/2019, 7:56 AM

## 2019-08-31 NOTE — Progress Notes (Signed)
Discharged instructions/education/AVS given to patient with significant other at bedside and they both verbalized understanding. Patient HR 110-130s, stat EKG done showing only sinus tach, MD aware. Per patient and significant other that HR will only go down when he's discharged and out of the hospital per patient's experienced. No other complaints noted, other VS are WNL. Patient ambulating well with walker. Dressing clean dry and intact. no swelling, no drainage, no redness noted on incision site. Patient voiding and emptying bladder well. Discharged via wheelchair

## 2019-08-31 NOTE — Progress Notes (Signed)
Pt awake at this time, A/O x4.  Pt stated, "My legs are fine now but my back still hurts".  Pt denies tingling or numbness to LLE.  Pt was encouraged to use bathroom and was assisted to get out of bed with hip precautions--- to not cross legs and bend hip to more than 90 degrees.  Pt able to ambulate to the bathroom with a front wheel walker.  Upon reaching commode, pt got so dizzy that this RN had to quickly assist patient down to floor.  Pt appeared to have passed out briefly on being layed down.  Pt's vital signs: BP101/69, P101, T98.0, R18, CBG=139.  Pt denied significant changes after the assisted fall--- no change in pain or degree of movement to extremities, neurochecks within normal limits.  Assisted pt off floor with three-person assist, and pt was able to ambulate back to bed.  Pt requested that spouse not to be notified at this time.

## 2019-08-31 NOTE — Progress Notes (Addendum)
Hgb 10.1. he did therapy this AM. Likely EBL was not 1250 as CRNA felt from looking in cannister. Not sure if it had additional blood loss from former case. He is not orthostatic and can be discharged later today if he continues making progress with PT.    Thigh soft no swelling. Leg lengh equal.

## 2019-08-31 NOTE — Progress Notes (Signed)
Physical Therapy Treatment Patient Details Name: Bryce Kelley MRN: [id number] DOB: February 11, 1971 Today's Date: 08/31/2019    History of Present Illness Patient is a 49 y/o male who presents s/p left THA, direct anterior approach 5/10. PMH includes Wolff-Parkinson-White syndrome, depression, anxiety.    PT Comments    Patient progressing well this afternoon. Improved ambulation distance with supervision for safety. Continues to exhibit antalgic gait and needs cues for more normalized gait mechanics. Tolerated stair training with Min guard assist for balance/safety. HR 140-150 bpm during session despite anxiety meds given earlier. Provided exercise handout and reviewed in length. Discussed car transfer technique. Plans to d/c home this PM. Will follow if still in hospital.   Follow Up Recommendations  Follow surgeon's recommendation for DC plan and follow-up therapies;Supervision for mobility/OOB     Equipment Recommendations  Rolling walker with 5" wheels    Recommendations for Other Services       Precautions / Restrictions Precautions Precautions: Fall Precaution Comments:  Restrictions Weight Bearing Restrictions: Yes RLE Weight Bearing: Weight bearing as tolerated    Mobility  Bed Mobility Overal bed mobility: Needs Assistance Bed Mobility: Supine to Sit;Sit to Supine     Supine to sit: Supervision;HOB elevated Sit to supine: Min assist;HOB elevated   General bed mobility comments: Assist to bring LLE into bed to return to supine. No assist to get to EOB.  Transfers Overall transfer level: Needs assistance Equipment used: Rolling walker (2 wheeled) Transfers: Sit to/from Stand Sit to Stand: Supervision         General transfer comment: Supervision for safety.  Ambulation/Gait Ambulation/Gait assistance: Supervision Gait Distance (Feet): 150 Feet Assistive device: Rolling walker (2 wheeled) Gait Pattern/deviations: Step-through pattern;Antalgic;Decreased  stance time - left Gait velocity: reduced Gait velocity interpretation: 1.31 - 2.62 ft/sec, indicative of limited community ambulator General Gait Details: Slow, antalgic like gait with cues for knee flexion during swing phase.. HR 140-150 bpm.   Stairs Stairs: Yes Stairs assistance: Min guard Stair Management: Step to pattern;One rail Right Number of Stairs: 5 General stair comments: Cues for technique and safety.   Wheelchair Mobility    Modified Rankin (Stroke Patients Only)       Balance Overall balance assessment: Needs assistance Sitting-balance support: Feet supported;No upper extremity supported Sitting balance-Leahy Scale: Good     Standing balance support: During functional activity Standing balance-Leahy Scale: Poor Standing balance comment: Requires UE support.                            Cognition Arousal/Alertness: Awake/alert Behavior During Therapy: Flat affect Overall Cognitive Status: Within Functional Limits for tasks assessed                                 General Comments: Appears WFL otherwise, asking appropriate questions related to riding bike      Exercises Total Joint Exercises Ankle Circles/Pumps: AROM;Both;10 reps;Supine Quad Sets: AROM;Both;10 reps;Supine Gluteal Sets: AROM;Both;5 reps;Supine Heel Slides: AROM;Left;10 reps;Supine(x2 sets) Hip ABduction/ADduction: AROM;Left;10 reps;Supine(x2 sets) Long Arc Quad: AROM;Left;10 reps;Seated    General Comments        Pertinent Vitals/Pain Pain Assessment: Faces Faces Pain Scale: Hurts little more Pain Location: left thigh Pain Descriptors / Indicators: Sore;Aching;Operative site guarding Pain Intervention(s): Repositioned;Monitored during session;RN gave pain meds during session    [place] expects to be discharged to:: Private residence Living Arrangements: Parent;Children(son and mom and  g/f can help) Available Help at Discharge:  Friend(s);Family;Available PRN/intermittently Type of Home: House Home Access: Stairs to enter Entrance Stairs-Rails: Right Home Layout: One level Home Equipment: Emergency planning/management officer - 4 wheels      Prior Function Level of Independence: Independent      Comments: works as Architect, walking on feet a lot. Drives.   PT Goals (current goals can now be found in the care plan section) Acute Rehab PT Goals Patient Stated Goal: to feel better PT Goal Formulation: With patient Time For Goal Achievement: 09/14/19 Potential to Achieve Goals: Good Progress towards PT goals: Progressing toward goals    Frequency    7X/week      PT Plan Current plan remains appropriate    Co-evaluation              AM-PAC PT "6 Clicks" Mobility   Outcome Measure  Help needed turning from your back to your side while in a flat bed without using bedrails?: None Help needed moving from lying on your back to sitting on the side of a flat bed without using bedrails?: A Little Help needed moving to and from a bed to a chair (including a wheelchair)?: None Help needed standing up from a chair using your arms (e.g., wheelchair or bedside chair)?: None Help needed to walk in hospital room?: A Little Help needed climbing 3-5 steps with a railing? : A Little 6 Click Score: 21    End of Session Equipment Utilized During Treatment: Gait belt Activity Tolerance: Patient tolerated treatment well Patient left: in bed;with call bell/phone within reach Nurse Communication: Mobility status PT Visit Diagnosis: Pain;Difficulty in walking, not elsewhere classified (R26.2) Pain - Right/Left: Left Pain - part of body: Hip     Time: 8546-2703 PT Time Calculation (min) (ACUTE ONLY): 23 min  Charges:  $Gait Training: 8-22 mins $Therapeutic Exercise: 8-22 mins                     Vale Haven, PT, DPT Acute Rehabilitation Services Pager [phone] Office [phone]       Blake Divine A  Lanier Ensign 08/31/2019, 12:22 PM

## 2019-08-31 NOTE — Progress Notes (Signed)
Dr. Ophelia Charter was paged to notify him of pt's assisted fall and present condition.

## 2019-08-31 NOTE — Evaluation (Signed)
Physical Therapy Evaluation Patient Details Name: Bryce Kelley MRN: [id number] DOB: 1970-05-21 Today's Date: 08/31/2019   History of Present Illness  Patient is a 49 y/o male who presents s/p left THA, direct anterior approach 5/10. PMH includes Wolff-Parkinson-White syndrome, depression, anxiety.  Clinical Impression  Patient presents with pain, dizziness and post surgical deficits s/p above surgery. Pt reports being independent and working PTA. Has support from son, g/f and mother at d/c. Today, pt tolerated bed mobility, transfers and gait training with Min guard assist for balance/safety and use of RW. Dizziness limiting mobility. Instructed pt in there ex. Will provide handout in PM session and perform stair training. Will follow acutely to maximize independence and mobility prior to return home.    Follow Up Recommendations Follow surgeon's recommendation for DC plan and follow-up therapies;Supervision for mobility/OOB    Equipment Recommendations  Rolling walker with 5" wheels    Recommendations for Other Services       Precautions / Restrictions Precautions Precautions: Fall Precaution Comments: dizzy Restrictions Weight Bearing Restrictions: Yes RLE Weight Bearing: Weight bearing as tolerated      Mobility  Bed Mobility Overal bed mobility: Needs Assistance Bed Mobility: Supine to Sit;Sit to Supine     Supine to sit: Supervision;HOB elevated Sit to supine: Supervision;HOB elevated   General bed mobility comments: Use of rail and increased time but no assist needed.  Transfers Overall transfer level: Needs assistance Equipment used: Rolling walker (2 wheeled) Transfers: Sit to/from Stand Sit to Stand: Min guard;From elevated surface         General transfer comment: Min guard for safety from elevated bed height to simulate bed at home. Cues for hand placement.  Ambulation/Gait Ambulation/Gait assistance: Min guard Gait Distance (Feet): 120  Feet Assistive device: Rolling walker (2 wheeled) Gait Pattern/deviations: Step-through pattern;Antalgic;Decreased stance time - left Gait velocity: decreased   General Gait Details: Slow, antalgic like gait with dizziness reported throughout limiting distance. HR 120-138 bpm.  Stairs            Wheelchair Mobility    Modified Rankin (Stroke Patients Only)       Balance Overall balance assessment: Needs assistance Sitting-balance support: Feet supported;No upper extremity supported Sitting balance-Leahy Scale: Good     Standing balance support: During functional activity Standing balance-Leahy Scale: Poor Standing balance comment: Requires UE support.                             Pertinent Vitals/Pain Pain Assessment: Faces Faces Pain Scale: Hurts even more Pain Location: left thigh Pain Descriptors / Indicators: Sore;Aching;Operative site guarding Pain Intervention(s): Repositioned;Monitored during session;Limited activity within patient's tolerance    Home Living Family/patient expects to be discharged to:: Private residence Living Arrangements: Parent;Children(son and mom and g/f can help) Available Help at Discharge: Friend(s);Family;Available PRN/intermittently Type of Home: House Home Access: Stairs to enter Entrance Stairs-Rails: Right Entrance Stairs-Number of Steps: 8 Home Layout: One level Home Equipment: Emergency planning/management officer - 4 wheels      Prior Function Level of Independence: Independent         Comments: works as Architect, walking on feet a lot. Drives.     Hand Dominance        Extremity/Trunk Assessment   Upper Extremity Assessment Upper Extremity Assessment: Defer to OT evaluation    Lower Extremity Assessment Lower Extremity Assessment: LLE deficits/detail LLE Deficits / Details: post surgical deficits, limited hip flexion AROM. LLE: Unable to fully  assess due to pain LLE Sensation: WNL    Cervical / Trunk  Assessment Cervical / Trunk Assessment: Normal  Communication   Communication: No difficulties  Cognition Arousal/Alertness: Awake/alert Behavior During Therapy: Flat affect Overall Cognitive Status: Within Functional Limits for tasks assessed                                 General Comments: Appears WFL otherwise, asking appropriate questions related to riding bike      General Comments      Exercises Total Joint Exercises Ankle Circles/Pumps: AROM;Both;10 reps;Supine Quad Sets: AROM;Both;10 reps;Supine Gluteal Sets: AROM;Both;5 reps;Supine Long Arc Quad: AROM;Left;10 reps;Seated   Assessment/Plan    PT Assessment Patient needs continued PT services  PT Problem List Decreased strength;Decreased mobility;Decreased range of motion;Pain;Decreased balance;Decreased activity tolerance;Cardiopulmonary status limiting activity       PT Treatment Interventions Therapeutic activities;Gait training;Therapeutic exercise;Patient/family education;Balance training;Stair training;Functional mobility training    PT Goals (Current goals can be found in the Care Plan section)  Acute Rehab PT Goals Patient Stated Goal: to feel better PT Goal Formulation: With patient Time For Goal Achievement: 09/14/19 Potential to Achieve Goals: Good    Frequency 7X/week   Barriers to discharge Inaccessible home environment stairs to enter home    Co-evaluation               AM-PAC PT "6 Clicks" Mobility  Outcome Measure Help needed turning from your back to your side while in a flat bed without using bedrails?: None Help needed moving from lying on your back to sitting on the side of a flat bed without using bedrails?: A Little Help needed moving to and from a bed to a chair (including a wheelchair)?: A Little Help needed standing up from a chair using your arms (e.g., wheelchair or bedside chair)?: A Little Help needed to walk in hospital room?: A Little Help needed climbing  3-5 steps with a railing? : A Little 6 Click Score: 19    End of Session Equipment Utilized During Treatment: Gait belt Activity Tolerance: Patient tolerated treatment well;Patient limited by pain Patient left: in bed;with nursing/sitter in room(sitting EOB with nursing in room) Nurse Communication: Mobility status PT Visit Diagnosis: Pain;Difficulty in walking, not elsewhere classified (R26.2) Pain - Right/Left: Left Pain - part of body: Hip    Time: 3382-5053 PT Time Calculation (min) (ACUTE ONLY): 20 min   Charges:   PT Evaluation $PT Eval Moderate Complexity: 1 Mod          Marisa Severin, PT, DPT Acute Rehabilitation Services Pager [phone] Office [phone]      Marguarite Arbour A Sabra Heck 08/31/2019, 11:43 AM

## 2019-09-06 ENCOUNTER — Telehealth: Payer: Self-pay | Admitting: Orthopaedic Surgery

## 2019-09-06 NOTE — Telephone Encounter (Signed)
Please advise 

## 2019-09-06 NOTE — Telephone Encounter (Signed)
FYi, I called him and discussed , not hurting much, cut back on meds and use tylenol, advil.

## 2019-09-06 NOTE — Telephone Encounter (Signed)
Pt called in stating he recently had surgery and the pain killers he's on is making him feel sick and he would like to know if it could be switched to anything else.   [phone]

## 2019-09-06 NOTE — Telephone Encounter (Signed)
noted 

## 2019-09-07 ENCOUNTER — Encounter: Payer: Self-pay | Admitting: Orthopaedic Surgery

## 2019-09-07 ENCOUNTER — Other Ambulatory Visit: Payer: Self-pay

## 2019-09-07 ENCOUNTER — Ambulatory Visit (INDEPENDENT_AMBULATORY_CARE_PROVIDER_SITE_OTHER): Payer: BC Managed Care – PPO | Admitting: Orthopaedic Surgery

## 2019-09-07 DIAGNOSIS — Z96642 Presence of left artificial hip joint: Secondary | ICD-10-CM

## 2019-09-07 HISTORY — DX: Presence of left artificial hip joint: Z96.642

## 2019-09-07 NOTE — Discharge Summary (Signed)
Patient ID: Bryce Kelley MRN: [id number] DOB/AGE: Feb 17, 1971 49 y.o.  Admit date: 08/30/2019 Discharge date: 09/07/2019  Admission Diagnoses:  Active Problems:   Arthritis of left hip   Discharge Diagnoses:  Active Problems:   Arthritis of left hip  status post Procedure(s): LEFT TOTAL HIP ARTHROPLASTY ANTERIOR APPROACH  Past Medical History:  Diagnosis Date  . Acid reflux   . Anxiety   . Arthritis   . Depression   . Gastric ulcer   . Thyroid disease   . Wolff-Parkinson-White syndrome     Surgeries: Procedure(s): LEFT TOTAL HIP ARTHROPLASTY ANTERIOR APPROACH on 08/30/2019   Consultants:   Discharged Condition: Improved  Hospital Course: Bryce Kelley is an 49 y.o. male who was admitted 08/30/2019 for operative treatment of left hip DJD. Patient failed conservative treatments (please see the history and physical for the specifics) and had severe unremitting pain that affects sleep, daily activities and work/hobbies. After pre-op clearance, the patient was taken to the operating room on 08/30/2019 and underwent  Procedure(s): LEFT TOTAL HIP ARTHROPLASTY ANTERIOR APPROACH.    Patient was given perioperative antibiotics:  Anti-infectives (From admission, onward)   Start     Dose/Rate Route Frequency Ordered Stop   08/31/19 0600  vancomycin (VANCOREADY) IVPB 1500 mg/300 mL     1,500 mg 150 mL/hr over 120 Minutes Intravenous On call to O.R. 08/30/19 1325 08/31/19 0250       Patient was given sequential compression devices and early ambulation to prevent DVT.   Patient benefited maximally from hospital stay and there were no complications. At the time of discharge, the patient was urinating/moving their bowels without difficulty, tolerating a regular diet, pain is controlled with oral pain medications and they have been cleared by PT/OT.   Recent vital signs: No data found.   Recent laboratory studies: No results for input(s): WBC, HGB, HCT, PLT, NA, K, CL, CO2,  BUN, CREATININE, GLUCOSE, INR, CALCIUM in the last 72 hours.  Invalid input(s): PT, 2   Discharge Medications:   Allergies as of 08/31/2019      Reactions   Motrin [ibuprofen] Swelling   Tongue swelling   Amoxicillin Rash      Medication List    STOP taking these medications   diphenhydramine-acetaminophen 25-500 MG Tabs tablet Commonly known as: TYLENOL PM     TAKE these medications   Aleve PM 220-25 MG Tabs Generic drug: Naproxen Sod-diphenhydrAMINE Take 1 tablet by mouth at bedtime as needed (sleep).   ALPRAZolam 0.25 MG tablet Commonly known as: XANAX Take 0.25 mg by mouth daily as needed for anxiety.   aspirin 325 MG EC tablet Take 1 tablet (325 mg total) by mouth daily with breakfast.   CITRACAL PO Take 1 tablet by mouth every other day.   GARLIC PO Take 6,295 mg by mouth daily.   ICAPS AREDS 2 PO Take 1 capsule by mouth 2 (two) times daily.   loratadine 10 MG tablet Commonly known as: CLARITIN Take 10 mg by mouth daily.   menthol-cetylpyridinium 3 MG lozenge Commonly known as: CEPACOL Take 1 lozenge (3 mg total) by mouth as needed for sore throat (sore throat).   methocarbamol 500 MG tablet Commonly known as: ROBAXIN Take 1 tablet (500 mg total) by mouth every 6 (six) hours as needed for muscle spasms.   montelukast 10 MG tablet Commonly known as: SINGULAIR Take 10 mg by mouth daily.   multivitamin with minerals Tabs tablet Take 1 tablet by mouth daily.   Omega 3  500 500 MG Caps Take 500 mg by mouth daily.   omeprazole 20 MG capsule Commonly known as: PRILOSEC Take 20 mg by mouth daily.   OVER THE COUNTER MEDICATION Take 1 tablet by mouth daily. Immunety otc supplment   oxyCODONE-acetaminophen 5-325 MG tablet Commonly known as: Percocet Take 1-2 tablets by mouth every 6 (six) hours as needed for severe pain. Post  Op hip arthroplasty   TYLENOL SINUS+HEADACHE PO Take 2 tablets by mouth at bedtime as needed (congestion).   VITAMIN C  PO Take 1,600 mg by mouth daily.       Diagnostic Studies: DG C-Arm 1-60 Min  Result Date: 08/30/2019 CLINICAL DATA:  Left hip arthroplasty EXAM: OPERATIVE left HIP (WITH PELVIS IF PERFORMED) 2 VIEWS TECHNIQUE: Fluoroscopic spot image(s) were submitted for interpretation post-operatively. COMPARISON:  03/30/2019 FINDINGS: Two low resolution intraoperative spot views of the left hip. Total fluoroscopy time was 32.3 seconds. The images demonstrate a left hip replacement with normal alignment IMPRESSION: Intraoperative fluoroscopic assistance provided during left hip surgery Electronically Signed   By: Jasmine Pang M.D.   On: 08/30/2019 18:45   DG Hip Port Unilat With Pelvis 1V Left  Result Date: 08/30/2019 CLINICAL DATA:  Left hip arthroplasty EXAM: DG HIP (WITH OR WITHOUT PELVIS) 1V PORT LEFT COMPARISON:  03/30/2019 FINDINGS: Frontal view of the pelvis as well as cross-table lateral view of the left hip demonstrates left hip arthroplasty in the expected position without signs of acute complication. Postsurgical changes in the overlying soft tissues. IMPRESSION: 1. Unremarkable left hip arthroplasty. Electronically Signed   By: Sharlet Salina M.D.   On: 08/30/2019 18:57   DG HIP OPERATIVE UNILAT W OR W/O PELVIS LEFT  Result Date: 08/30/2019 CLINICAL DATA:  Left hip arthroplasty EXAM: OPERATIVE left HIP (WITH PELVIS IF PERFORMED) 2 VIEWS TECHNIQUE: Fluoroscopic spot image(s) were submitted for interpretation post-operatively. COMPARISON:  03/30/2019 FINDINGS: Two low resolution intraoperative spot views of the left hip. Total fluoroscopy time was 32.3 seconds. The images demonstrate a left hip replacement with normal alignment IMPRESSION: Intraoperative fluoroscopic assistance provided during left hip surgery Electronically Signed   By: Jasmine Pang M.D.   On: 08/30/2019 18:45      Follow-up Information    Eldred Manges, MD Follow up in 1 week(s).   Specialty: Orthopedic Surgery Contact  information: [address] [place] [address] [phone]           Discharge Plan:  discharge to home Disposition:     Signed: Zonia Kief  09/07/2019, 10:03 AM

## 2019-09-07 NOTE — Progress Notes (Signed)
   Post-Op Visit Note   Patient: Bryce Kelley           Date of Birth: 1970-07-08           MRN: [id number] Visit Date: 09/07/2019 PCP: Lonie Peak, PA-C   Assessment & Plan: Postop left total hip arthroplasty Surgeon aching in his thigh with ambulation and was concerned he also noticed trace swelling in his foot he has had problems with irritation of stomach when he is taking a lot of aspirin in the past I discussed with him he only needs 1 aspirin a day he can take a baby aspirin if he decides to take that instead and he could use 1 baby aspirin twice a day.  He will return next week we will get x-rays on return.  There is only trace swelling of the lower extremity which is normal as expected for surgery. No history of DVT no shortness of breath no dyspnea.  Patient states she has had trouble sleeping and is taken Tylenol PM at night and only took pain pills yesterday did not take any today. Chief Complaint: No chief complaint on file.  Visit Diagnoses:  1. Hx of total hip arthroplasty, left     Plan: Return next week as planned standard postop x-rays on return he will take 1 aspirin a day to decrease DVT risk.  Follow-Up Instructions: No follow-ups on file.   Orders:  No orders of the defined types were placed in this encounter.  No orders of the defined types were placed in this encounter.   Imaging: No results found.  PMFS History: Patient Active Problem List   Diagnosis Date Noted  . 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 Dyspnea on exertion 05/07/2019  . GERD (gastroesophageal reflux disease) 05/07/2019   Past Medical History:  Diagnosis Date  . Acid reflux   . Anxiety   . Arthritis   . Depression   . Gastric ulcer   . Thyroid disease   . Wolff-Parkinson-White syndrome     Family History  Problem Relation Age of Onset  . Heart attack Father     Past Surgical History:  Procedure Laterality Date  . TONSILLECTOMY    . TOTAL HIP ARTHROPLASTY  Left 08/30/2019   Procedure: LEFT TOTAL HIP ARTHROPLASTY ANTERIOR APPROACH;  Surgeon: Eldred Manges, MD;  Location: [facility] OR;  Service: Orthopedics;  Laterality: Left;   Social History   Occupational History  . Not on file  Tobacco Use  . Smoking status: Never Smoker  . Smokeless tobacco: Never Used  Substance and Sexual Activity  . Alcohol use: Not Currently  . Drug use: Never  . Sexual activity: Not on file

## 2019-09-14 ENCOUNTER — Ambulatory Visit: Payer: Self-pay

## 2019-09-14 ENCOUNTER — Encounter: Payer: Self-pay | Admitting: Orthopaedic Surgery

## 2019-09-14 ENCOUNTER — Ambulatory Visit (INDEPENDENT_AMBULATORY_CARE_PROVIDER_SITE_OTHER): Payer: BC Managed Care – PPO | Admitting: Orthopaedic Surgery

## 2019-09-14 ENCOUNTER — Other Ambulatory Visit: Payer: Self-pay

## 2019-09-14 ENCOUNTER — Ambulatory Visit: Payer: BC Managed Care – PPO | Admitting: Cardiology

## 2019-09-14 VITALS — Ht 75.0 in | Wt 250.0 lb

## 2019-09-14 DIAGNOSIS — Z96642 Presence of left artificial hip joint: Secondary | ICD-10-CM

## 2019-09-14 NOTE — Progress Notes (Signed)
49 year old white male who is 2-week status post left total replacement returns.  States that he is doing well.  He is making progress.  Not taking a lot of pain medication.   Exam Gait is antalgic.  Hip staples removed and Steri-Strips applied.  No drainage or signs infection.  Does have some swelling although not extreme.   X-ray Left hip shows good alignment and seating of his prosthesis.    Plan He will gradually increase his walking distance.  Work on hip exercises that were given by the physical therapist at the hospital.  Given a note for work anticipating that he will be out at least another 4 to 6 weeks and he will follow up with Dr. Ophelia Charter in 4 weeks for recheck and discuss return to work status at that time.

## 2019-10-12 ENCOUNTER — Encounter: Payer: Self-pay | Admitting: Orthopaedic Surgery

## 2019-10-12 ENCOUNTER — Ambulatory Visit (INDEPENDENT_AMBULATORY_CARE_PROVIDER_SITE_OTHER): Payer: BC Managed Care – PPO | Admitting: Orthopaedic Surgery

## 2019-10-12 VITALS — Ht 75.0 in | Wt 250.0 lb

## 2019-10-12 DIAGNOSIS — Z96642 Presence of left artificial hip joint: Secondary | ICD-10-CM

## 2019-10-12 NOTE — Progress Notes (Signed)
   Post-Op Visit Note   Patient: Bryce Kelley           Date of Birth: 1970/11/26           MRN: [id number] Visit Date: 10/12/2019 PCP: Lonie Peak, PA-C   Assessment & Plan: Post total hip arthroplasty left on 08/30/2019.  Incision looks good he is walking without a cane still a slight limp and we discussed abduction exercises.  Work slip given for no work until 10/25/2019 and then work resumption without restrictions 10/25/2019.  We reviewed x-rays leg lengths are equal and with some hip abduction exercise strengthening he should get complete resolution of his limp.  Chief Complaint:  Chief Complaint  Patient presents with  . Left Hip - Follow-up    08/30/2019 Left THA   Visit Diagnoses:  1. Hx of total hip arthroplasty, left     Plan: Work slip given as above recheck 1 month.  Follow-Up Instructions: Return in about 1 month (around 11/11/2019).   Orders:  No orders of the defined types were placed in this encounter.  No orders of the defined types were placed in this encounter.   Imaging: No results found.  PMFS History: Patient Active Problem List   Diagnosis Date Noted  . 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 Dyspnea on exertion 05/07/2019  . GERD (gastroesophageal reflux disease) 05/07/2019   Past Medical History:  Diagnosis Date  . Acid reflux   . Anxiety   . Arthritis   . Depression   . Gastric ulcer   . Thyroid disease   . Wolff-Parkinson-White syndrome     Family History  Problem Relation Age of Onset  . Heart attack Father     Past Surgical History:  Procedure Laterality Date  . TONSILLECTOMY    . TOTAL HIP ARTHROPLASTY Left 08/30/2019   Procedure: LEFT TOTAL HIP ARTHROPLASTY ANTERIOR APPROACH;  Surgeon: Eldred Manges, MD;  Location: [facility] OR;  Service: Orthopedics;  Laterality: Left;   Social History   Occupational History  . Not on file  Tobacco Use  . Smoking status: Never Smoker  . Smokeless tobacco: Never Used  Vaping Use  .  Vaping Use: Never used  Substance and Sexual Activity  . Alcohol use: Not Currently  . Drug use: Never  . Sexual activity: Not on file

## 2019-10-13 ENCOUNTER — Telehealth: Payer: Self-pay | Admitting: Orthopaedic Surgery

## 2019-10-13 NOTE — Telephone Encounter (Signed)
Pt called asking to speak with Tamela Oddi if possible before the day is out.  [phone]

## 2019-10-14 ENCOUNTER — Telehealth: Payer: Self-pay | Admitting: Orthopaedic Surgery

## 2019-10-14 NOTE — Telephone Encounter (Signed)
FYI   I called patient. He wanted to be sure that he could D/C the aspirin that he was taking post operatively. I advised he only needed x 4 weeks.  (Per chart note, no history of blood clot, etc.)

## 2019-10-14 NOTE — Telephone Encounter (Signed)
Called pt. Duplicate message in chart. 

## 2019-10-14 NOTE — Telephone Encounter (Signed)
Pt called asking to speak with Betsy if possible before the day is out.  [phone] 

## 2019-10-21 ENCOUNTER — Ambulatory Visit: Payer: BC Managed Care – PPO | Admitting: Cardiology

## 2019-10-26 DIAGNOSIS — J329 Chronic sinusitis, unspecified: Secondary | ICD-10-CM | POA: Diagnosis not present

## 2019-10-26 DIAGNOSIS — K219 Gastro-esophageal reflux disease without esophagitis: Secondary | ICD-10-CM | POA: Diagnosis not present

## 2019-10-26 DIAGNOSIS — D62 Acute posthemorrhagic anemia: Secondary | ICD-10-CM | POA: Diagnosis not present

## 2019-10-26 DIAGNOSIS — Z79899 Other long term (current) drug therapy: Secondary | ICD-10-CM | POA: Diagnosis not present

## 2019-10-26 DIAGNOSIS — F418 Other specified anxiety disorders: Secondary | ICD-10-CM | POA: Diagnosis not present

## 2019-10-26 DIAGNOSIS — J309 Allergic rhinitis, unspecified: Secondary | ICD-10-CM | POA: Diagnosis not present

## 2019-12-02 ENCOUNTER — Ambulatory Visit: Payer: BC Managed Care – PPO | Admitting: Cardiology

## 2019-12-14 ENCOUNTER — Encounter: Payer: Self-pay | Admitting: Orthopaedic Surgery

## 2019-12-14 ENCOUNTER — Ambulatory Visit (INDEPENDENT_AMBULATORY_CARE_PROVIDER_SITE_OTHER): Payer: BC Managed Care – PPO | Admitting: Orthopaedic Surgery

## 2019-12-14 VITALS — BP 134/88 | Ht 75.0 in | Wt 257.0 lb

## 2019-12-14 DIAGNOSIS — Z96642 Presence of left artificial hip joint: Secondary | ICD-10-CM

## 2019-12-14 NOTE — Progress Notes (Signed)
49 year old white male who is status post left total hip replacement returns.  States that he is doing well.  He is back to work regular duty has been doing so since last month.  States that he occasionally has some start up soreness in his hip but after a few minutes this goes away.  He is pleased with his surgical result over this point.   Exam Pleasant male alert and oriented in no acute stress.  Gait is normal.  Negative logroll bilateral hips.  Neg Straight leg raise.  Neurovascular intact.   Plan Patient will continue to gradually increase his activities.  Follow-up 3 months for recheck with left hip xray.  Return sooner if needed.

## 2020-01-05 DIAGNOSIS — L719 Rosacea, unspecified: Secondary | ICD-10-CM | POA: Diagnosis not present

## 2020-01-05 DIAGNOSIS — Z6832 Body mass index (BMI) 32.0-32.9, adult: Secondary | ICD-10-CM | POA: Diagnosis not present

## 2020-01-27 DIAGNOSIS — H00014 Hordeolum externum left upper eyelid: Secondary | ICD-10-CM | POA: Diagnosis not present

## 2020-02-15 DIAGNOSIS — E079 Disorder of thyroid, unspecified: Secondary | ICD-10-CM | POA: Insufficient documentation

## 2020-02-15 DIAGNOSIS — K259 Gastric ulcer, unspecified as acute or chronic, without hemorrhage or perforation: Secondary | ICD-10-CM | POA: Insufficient documentation

## 2020-02-15 DIAGNOSIS — I456 Pre-excitation syndrome: Secondary | ICD-10-CM | POA: Insufficient documentation

## 2020-02-15 DIAGNOSIS — M1990 Unspecified osteoarthritis, unspecified site: Secondary | ICD-10-CM | POA: Insufficient documentation

## 2020-02-15 DIAGNOSIS — K219 Gastro-esophageal reflux disease without esophagitis: Secondary | ICD-10-CM | POA: Insufficient documentation

## 2020-02-15 DIAGNOSIS — F419 Anxiety disorder, unspecified: Secondary | ICD-10-CM | POA: Insufficient documentation

## 2020-02-15 DIAGNOSIS — F32A Depression, unspecified: Secondary | ICD-10-CM | POA: Insufficient documentation

## 2020-02-17 ENCOUNTER — Other Ambulatory Visit: Payer: Self-pay

## 2020-02-17 ENCOUNTER — Encounter: Payer: Self-pay | Admitting: Cardiology

## 2020-02-17 ENCOUNTER — Ambulatory Visit (INDEPENDENT_AMBULATORY_CARE_PROVIDER_SITE_OTHER): Payer: BC Managed Care – PPO | Admitting: Cardiology

## 2020-02-17 VITALS — BP 134/84 | HR 90 | Ht 75.0 in | Wt 254.4 lb

## 2020-02-17 DIAGNOSIS — R0600 Dyspnea, unspecified: Secondary | ICD-10-CM

## 2020-02-17 DIAGNOSIS — I456 Pre-excitation syndrome: Secondary | ICD-10-CM | POA: Diagnosis not present

## 2020-02-17 DIAGNOSIS — Z96642 Presence of left artificial hip joint: Secondary | ICD-10-CM | POA: Diagnosis not present

## 2020-02-17 DIAGNOSIS — R0609 Other forms of dyspnea: Secondary | ICD-10-CM

## 2020-02-17 NOTE — Patient Instructions (Signed)

## 2020-02-17 NOTE — Addendum Note (Signed)
Addended by: Hazle Quant on: 02/17/2020 02:24 PM   Modules accepted: Orders

## 2020-02-17 NOTE — Progress Notes (Signed)
Cardiology Office Note:    Date:  02/17/2020   ID:  Bryce Kelley, DOB 1970-11-12, MRN [id number]  PCP:  Lonie Peak, PA-C  Cardiologist:  Gypsy Balsam, MD    Referring MD: Lonie Peak, PA-C   No chief complaint on file. I am doing well  History of Present Illness:    Bryce Kelley is a 49 y.o. male with past medical history significant for WPW.  I did see him about a year ago and I could not see any preexcitation on EKG however he does carry diagnosis of WPW.  I did see him for evaluation before hip surgery.  He went to surgery with no difficulties but 6 hours after surgery nurse was trying to get him up and make him go to the restroom and he passed out.  That was the only time however he passed out.  He denies having any palpitations there is no otherwise dizziness or syncope.  There is no chest pain tightness squeezing pressure burning chest.  He said first 2 weeks after hip replacement surgery was stopped but now he is very happy that he had it done.  Past Medical History:  Diagnosis Date  . Acid reflux   . Anxiety   . Arthritis   . Depression   . Dyspnea on exertion 05/07/2019  . Gastric ulcer   . GERD (gastroesophageal reflux disease) 05/07/2019  . 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 Thyroid disease   . Wolff-Parkinson-White syndrome     Past Surgical History:  Procedure Laterality Date  . TONSILLECTOMY    . TOTAL HIP ARTHROPLASTY Left 08/30/2019   Procedure: LEFT TOTAL HIP ARTHROPLASTY ANTERIOR APPROACH;  Surgeon: Eldred Manges, MD;  Location: [facility] OR;  Service: Orthopedics;  Laterality: Left;    Current Medications: Current Meds  Medication Sig  . ALPRAZolam (XANAX) 0.25 MG tablet Take 0.25 mg by mouth daily as needed for anxiety.   . Ascorbic Acid (VITAMIN C PO) Take 1,600 mg by mouth daily.  . Calcium Citrate (CITRACAL PO) Take 1 tablet by mouth every other day.  Marland Kitchen GARLIC PO Take 2,841 mg by mouth daily.  Marland Kitchen loratadine (CLARITIN) 10 MG  tablet Take 10 mg by mouth daily.  . montelukast (SINGULAIR) 10 MG tablet Take 10 mg by mouth daily.  . Multiple Vitamin (MULTIVITAMIN WITH MINERALS) TABS tablet Take 1 tablet by mouth daily.  . Multiple Vitamins-Minerals (ICAPS AREDS 2 PO) Take 1 capsule by mouth 2 (two) times daily.  . Nutritional Supplements (VITAMIN D MAINTENANCE PO) Take 1 capsule by mouth daily.  . Omega-3 Fatty Acids (OMEGA 3 500) 500 MG CAPS Take 500 mg by mouth daily.  Marland Kitchen omeprazole (PRILOSEC) 20 MG capsule Take 20 mg by mouth daily.   Marland Kitchen OVER THE COUNTER MEDICATION Take 1 tablet by mouth daily. Immunety otc supplment  . Phenylephrine-Acetaminophen (TYLENOL SINUS+HEADACHE PO) Take 2 tablets by mouth at bedtime as needed (congestion).     Allergies:   Motrin [ibuprofen] and Amoxicillin   Social History   Socioeconomic History  . Marital status: Divorced    Spouse name: Not on file  . Number of children: Not on file  . Years of education: Not on file  . Highest education level: Not on file  Occupational History  . Not on file  Tobacco Use  . Smoking status: Never Smoker  . Smokeless tobacco: Never Used  Vaping Use  . Vaping Use: Never used  Substance and Sexual Activity  . Alcohol use:  Not Currently  . Drug use: Never  . Sexual activity: Not on file  Other Topics Concern  . Not on file  Social History Narrative  . Not on file   Social Determinants of Health   Financial Resource Strain:   . Difficulty of Paying Living Expenses: Not on file  Food Insecurity:   . Worried About Programme researcher, broadcasting/film/video in the Last Year: Not on file  . Ran Out of Food in the Last Year: Not on file  Transportation Needs:   . Lack of Transportation (Medical): Not on file  . Lack of Transportation (Non-Medical): Not on file  Physical Activity:   . Days of Exercise per Week: Not on file  . Minutes of Exercise per Session: Not on file  Stress:   . Feeling of Stress : Not on file  Social Connections:   . Frequency of  Communication with Friends and Family: Not on file  . Frequency of Social Gatherings with Friends and Family: Not on file  . Attends Religious Services: Not on file  . Active Member of Clubs or Organizations: Not on file  . Attends Banker Meetings: Not on file  . Marital Status: Not on file     Family History: The patient's family history includes Heart attack in his father. ROS:   Please see the history of present illness.    All 14 point review of systems negative except as described per history of present illness  EKGs/Labs/Other Studies Reviewed:      Recent Labs: 08/30/2019: ALT 25 08/31/2019: BUN 18; Creatinine, Ser 1.69; Hemoglobin 10.1; Platelets 159; Potassium 4.6; Sodium 138  Recent Lipid Panel No results found for: CHOL, TRIG, HDL, CHOLHDL, VLDL, LDLCALC, LDLDIRECT  Physical Exam:    VS:  BP 134/84   Pulse 90   Ht 6\' 3"  (1.905 m)   Wt 254 lb 6.4 oz (115.4 kg)   SpO2 98%   BMI 31.80 kg/m     Wt Readings from Last 3 Encounters:  02/17/20 254 lb 6.4 oz (115.4 kg)  12/14/19 257 lb (116.6 kg)  10/12/19 250 lb (113.4 kg)     GEN:  Well nourished, well developed in no acute distress HEENT: Normal NECK: No JVD; No carotid bruits LYMPHATICS: No lymphadenopathy CARDIAC: RRR, no murmurs, no rubs, no gallops RESPIRATORY:  Clear to auscultation without rales, wheezing or rhonchi  ABDOMEN: Soft, non-tender, non-distended MUSCULOSKELETAL:  No edema; No deformity  SKIN: Warm and dry LOWER EXTREMITIES: no swelling NEUROLOGIC:  Alert and oriented x 3 PSYCHIATRIC:  Normal affect   ASSESSMENT:    1. Wolff-Parkinson-White syndrome   2. Dyspnea on exertion   3. Hx of total hip arthroplasty, left    PLAN:    In order of problems listed above:  1. Wolff-Parkinson-White which I question this diagnosis.  We will repeat EKG today to see if there is any evidence of preexcitation.  His echocardiogram showed structurally normal heart.  I do not see indication  for monitor, he did have episode of syncope but it happened shortly after surgery 6 hours after hip replacement and he was trying to go to the restroom.  That happened in the hospital. 2. Dyspnea on exertion, improved significantly.  He does not have any segmental wall motion normalities or cardiomyopathy on echocardiogram. 3. Status post hip replacement.  Recovered nicely. 4. Cholesterol status unknown.  I will call primary care physician to get copy of his fasting lipid profile.   Medication Adjustments/Labs and  Tests Ordered: Current medicines are reviewed at length with the patient today.  Concerns regarding medicines are outlined above.  No orders of the defined types were placed in this encounter.  Medication changes: No orders of the defined types were placed in this encounter.   Signed, Georgeanna Lea, MD, [facility] 02/17/2020 2:18 PM    [facility]

## 2020-03-14 ENCOUNTER — Ambulatory Visit: Payer: Self-pay

## 2020-03-14 ENCOUNTER — Ambulatory Visit (INDEPENDENT_AMBULATORY_CARE_PROVIDER_SITE_OTHER): Payer: BC Managed Care – PPO | Admitting: Orthopaedic Surgery

## 2020-03-14 ENCOUNTER — Encounter: Payer: Self-pay | Admitting: Orthopaedic Surgery

## 2020-03-14 VITALS — Ht 75.0 in | Wt 254.0 lb

## 2020-03-14 DIAGNOSIS — Z96642 Presence of left artificial hip joint: Secondary | ICD-10-CM

## 2020-03-14 NOTE — Progress Notes (Signed)
Office Visit Note   Patient: Bryce Kelley           Date of Birth: 1971-02-27           MRN: [id number] Visit Date: 03/14/2020              Requested by: Bryce Peak, PA-C [address] [place],  [place] [address] PCP: Bryce Peak, PA-C   Assessment & Plan: Visit Diagnoses:  1. Status post total hip replacement, left     Plan: Post left total hip doing well.  He is happy with the surgical result and can return on an as-needed basis.  Follow-Up Instructions: Return if symptoms worsen or fail to improve.   Orders:  Orders Placed This Encounter  Procedures  . XR HIP UNILAT W OR W/O PELVIS 2-3 VIEWS LEFT   No orders of the defined types were placed in this encounter.     Procedures: No procedures performed   Clinical Data: No additional findings.   Subjective: Chief Complaint  Patient presents with  . Left Hip - Follow-up    08/30/2019 Left THA    HPI 49 year old male returns 6 months post total of arthroplasty left hip.  He is doing well ambulatory no limp no discomfort other than occasional start up discomfort the last for a few steps then resolves.  He is happy with the surgical result no problems with his right hip.  Patient is back working full-time without problems.  Review of Systems Possible WPW GERD history depression anxiety of acid reflux.  Objective: Vital Signs: Ht 6\' 3"  (1.905 m)   Wt 254 lb (115.2 kg)   BMI 31.75 kg/m   Physical Exam Constitutional:      Appearance: He is well-developed.  HENT:     Head: Normocephalic and atraumatic.  Eyes:     Pupils: Pupils are equal, round, and reactive to light.  Neck:     Thyroid: No thyromegaly.     Trachea: No tracheal deviation.  Cardiovascular:     Rate and Rhythm: Normal rate.  Pulmonary:     Effort: Pulmonary effort is normal.     Breath sounds: No wheezing.  Abdominal:     General: Bowel sounds are normal.     Palpations: Abdomen is soft.  Skin:    General: Skin is warm and  dry.     Capillary Refill: Capillary refill takes less than 2 seconds.  Neurological:     Mental Status: He is alert and oriented to person, place, and time.  Psychiatric:        Behavior: Behavior normal.        Thought Content: Thought content normal.        Judgment: Judgment normal.     Ortho Exam normal hip range of motion well-healed hip incision normal stride length negative Trendelenburg.  No extremity edema.  Normal range of motion right hip. Specialty Comments:  No specialty comments available.  Imaging: XR HIP UNILAT W OR W/O PELVIS 2-3 VIEWS LEFT  Result Date: 03/15/2020 AP pelvis frog-leg left hip obtained and reviewed.  Shows good position alignment left total hip arthroplasty without loosening or subsidence. Impression: Satisfactory left total hip arthroplasty.    PMFS History: Patient Active Problem List   Diagnosis Date Noted  . Wolff-Parkinson-White syndrome   . Thyroid disease   . Gastric ulcer   . Depression   . Arthritis   . Anxiety   . Acid reflux   . Hx of total hip  arthroplasty, 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  . Dyspnea on exertion 05/07/2019  . GERD (gastroesophageal reflux disease) 05/07/2019   Past Medical History:  Diagnosis Date  . Acid reflux   . Anxiety   . Arthritis   . Depression   . Dyspnea on exertion 05/07/2019  . Gastric ulcer   . GERD (gastroesophageal reflux disease) 05/07/2019  . 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 Thyroid disease   . Wolff-Parkinson-White syndrome     Family History  Problem Relation Age of Onset  . Heart attack Father     Past Surgical History:  Procedure Laterality Date  . TONSILLECTOMY    . TOTAL HIP ARTHROPLASTY Left 08/30/2019   Procedure: LEFT TOTAL HIP ARTHROPLASTY ANTERIOR APPROACH;  Surgeon: Eldred Manges, MD;  Location: [facility] OR;  Service: Orthopedics;  Laterality: Left;   Social History   Occupational History  . Not on file  Tobacco Use  . Smoking status: Never Smoker  . Smokeless tobacco:  Never Used  Vaping Use  . Vaping Use: Never used  Substance and Sexual Activity  . Alcohol use: Not Currently  . Drug use: Never  . Sexual activity: Not on file

## 2020-05-24 ENCOUNTER — Ambulatory Visit: Payer: BC Managed Care – PPO | Admitting: Orthopaedic Surgery

## 2020-06-06 ENCOUNTER — Ambulatory Visit: Payer: Self-pay

## 2020-06-06 ENCOUNTER — Ambulatory Visit: Payer: BC Managed Care – PPO | Admitting: Orthopaedic Surgery

## 2020-06-06 VITALS — BP 134/90 | HR 87 | Ht 75.0 in | Wt 254.0 lb

## 2020-06-06 DIAGNOSIS — M7712 Lateral epicondylitis, left elbow: Secondary | ICD-10-CM | POA: Diagnosis not present

## 2020-06-06 DIAGNOSIS — M25522 Pain in left elbow: Secondary | ICD-10-CM | POA: Diagnosis not present

## 2020-06-06 NOTE — Progress Notes (Unsigned)
Office Visit Note   Patient: Bryce Kelley           Date of Birth: 1971-01-19           MRN: [id number] Visit Date: 06/06/2020              Requested by: Lonie Peak, PA-C [address] [place],  [place] [address] PCP: Lonie Peak, PA-C   Assessment & Plan: Visit Diagnoses:  1. Pain in left elbow   2. Lateral epicondylitis, left elbow     Plan: Patient still has a tennis elbow brace that he is used for his opposite right elbow.  He can apply it on the left elbow and we discussed pathophysiology of condition.  To try to avoid activities worries doing extreme squeezing or lifting objects for out front of him that requires significant increase use of wrist extension.  He can use some Aspercreme over his elbow.  Currently symptoms are not severe enough to consider injection.  He can return if his symptoms do not continue to improve with conservative treatment.  Follow-Up Instructions: No follow-ups on file.   Orders:  Orders Placed This Encounter  Procedures  . XR Elbow Complete Left (3+View)   No orders of the defined types were placed in this encounter.     Procedures: No procedures performed   Clinical Data: No additional findings.   Subjective: Chief Complaint  Patient presents with  . Left Elbow - Pain    HPI 50 year old male seen with left elbow pain that began about 1 month ago.  He denies any injury.  He has had some discomfort that starts at the lateral epicondyle.  Past diagnosis of tennis elbow but his symptoms resolved with time.  Review of Systems positive for seasonal allergies previous left total hip arthroplasty.  Positive for GERD.  All other systems noncontributory to HPI.   Objective: Vital Signs: BP 134/90   Pulse 87   Ht 6\' 3"  (1.905 m)   Wt 254 lb (115.2 kg)   BMI 31.75 kg/m   Physical Exam Constitutional:      Appearance: He is well-developed and well-nourished.  HENT:     Head: Normocephalic and atraumatic.  Eyes:      Extraocular Movements: EOM normal.     Pupils: Pupils are equal, round, and reactive to light.  Neck:     Thyroid: No thyromegaly.     Trachea: No tracheal deviation.  Cardiovascular:     Rate and Rhythm: Normal rate.  Pulmonary:     Effort: Pulmonary effort is normal.     Breath sounds: No wheezing.  Abdominal:     General: Bowel sounds are normal.     Palpations: Abdomen is soft.  Skin:    General: Skin is warm and dry.     Capillary Refill: Capillary refill takes less than 2 seconds.  Neurological:     Mental Status: He is alert and oriented to person, place, and time.  Psychiatric:        Mood and Affect: Mood and affect normal.        Behavior: Behavior normal.        Thought Content: Thought content normal.        Judgment: Judgment normal.     Ortho Exam normal heel toe gait negative Trendelenburg pelvis is level.  Left elbow shows minimal tenderness over the lateral epicondyle.  Full extension good flexion.  Mild pain with resisted wrist extension. Specialty Comments:  No specialty comments  available.  Imaging: No results found.   PMFS History: Patient Active Problem List   Diagnosis Date Noted  . Lateral epicondylitis, left elbow 06/06/2020  . Wolff-Parkinson-White syndrome   . Thyroid disease   . Gastric ulcer   . Depression   . Arthritis   . Anxiety   . Acid reflux   . 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 Dyspnea on exertion 05/07/2019  . GERD (gastroesophageal reflux disease) 05/07/2019   Past Medical History:  Diagnosis Date  . Acid reflux   . Anxiety   . Arthritis   . Depression   . Dyspnea on exertion 05/07/2019  . Gastric ulcer   . GERD (gastroesophageal reflux disease) 05/07/2019  . 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 Thyroid disease   . Wolff-Parkinson-White syndrome     Family History  Problem Relation Age of Onset  . Heart attack Father     Past Surgical History:  Procedure Laterality Date  . TONSILLECTOMY     . TOTAL HIP ARTHROPLASTY Left 08/30/2019   Procedure: LEFT TOTAL HIP ARTHROPLASTY ANTERIOR APPROACH;  Surgeon: Eldred Manges, MD;  Location: [facility] OR;  Service: Orthopedics;  Laterality: Left;   Social History   Occupational History  . Not on file  Tobacco Use  . Smoking status: Never Smoker  . Smokeless tobacco: Never Used  Vaping Use  . Vaping Use: Never used  Substance and Sexual Activity  . Alcohol use: Not Currently  . Drug use: Never  . Sexual activity: Not on file

## 2020-11-24 IMAGING — RF DG C-ARM 1-60 MIN
1 series · 2 of 2 positions shown · non-contrast
Comparison: 03/30/2019

CLINICAL DATA: Left hip arthroplasty

EXAM:
OPERATIVE left HIP (WITH PELVIS IF PERFORMED) 2 VIEWS
TECHNIQUE: Fluoroscopic spot image(s) were submitted for interpretation
post-operatively.

[Series 1: unknown protocol · 0.20mm/px · 2 of 2 slices shown]
[im 1/2]
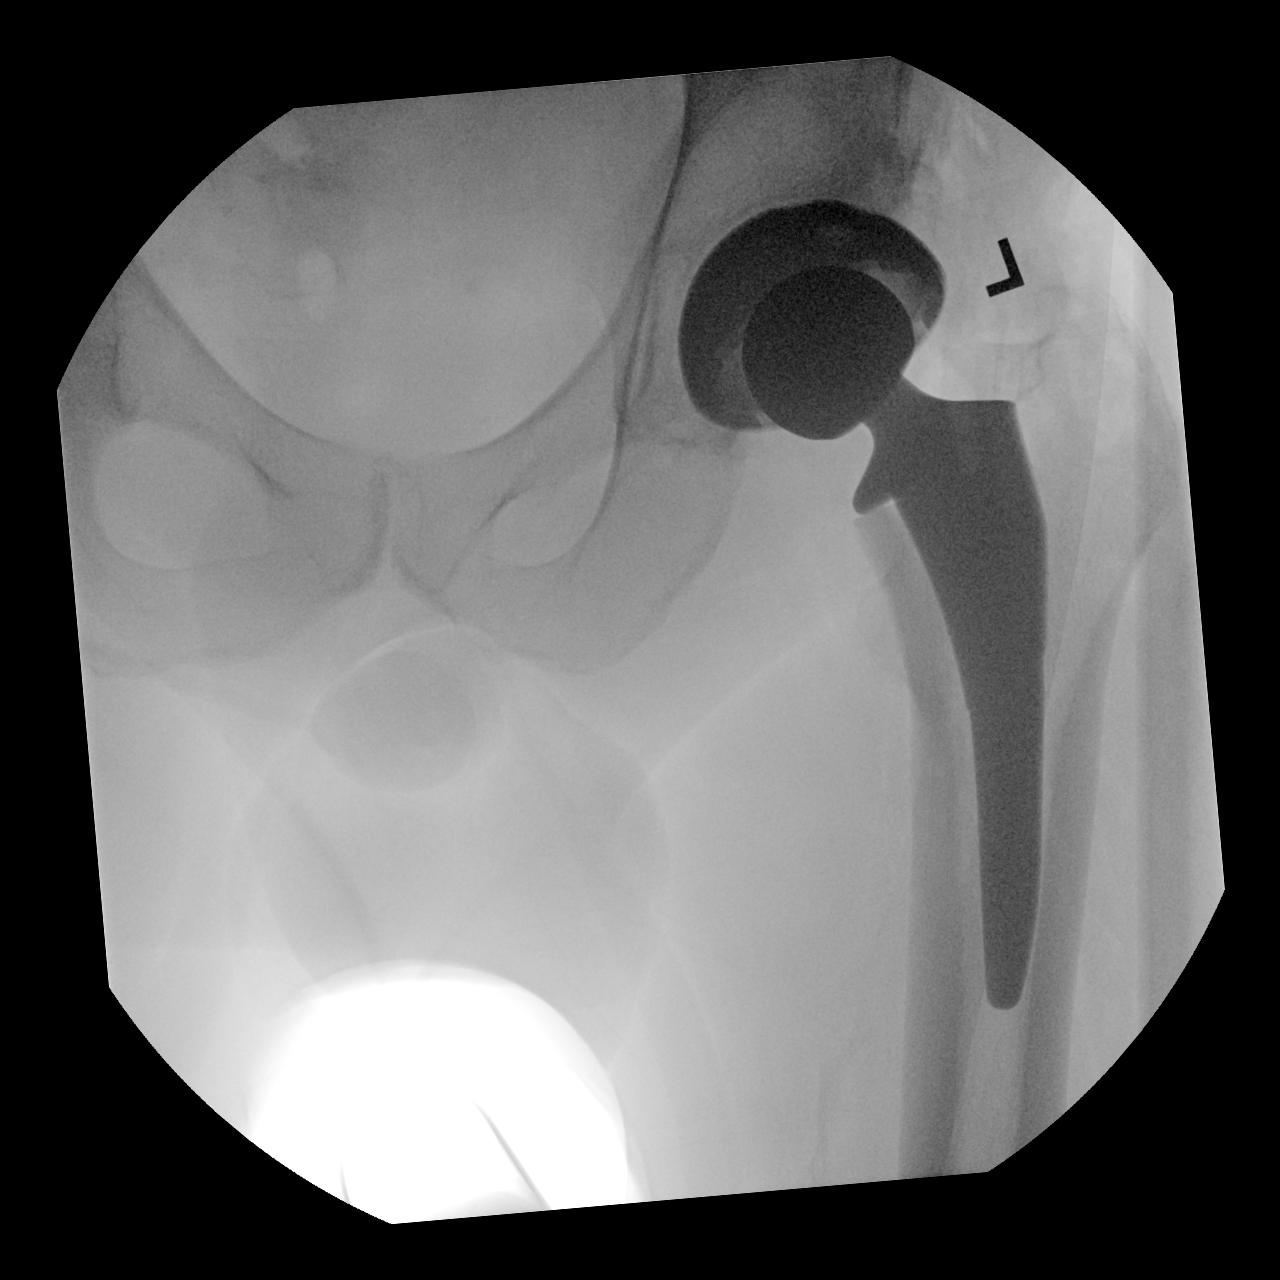
[im 2/2]
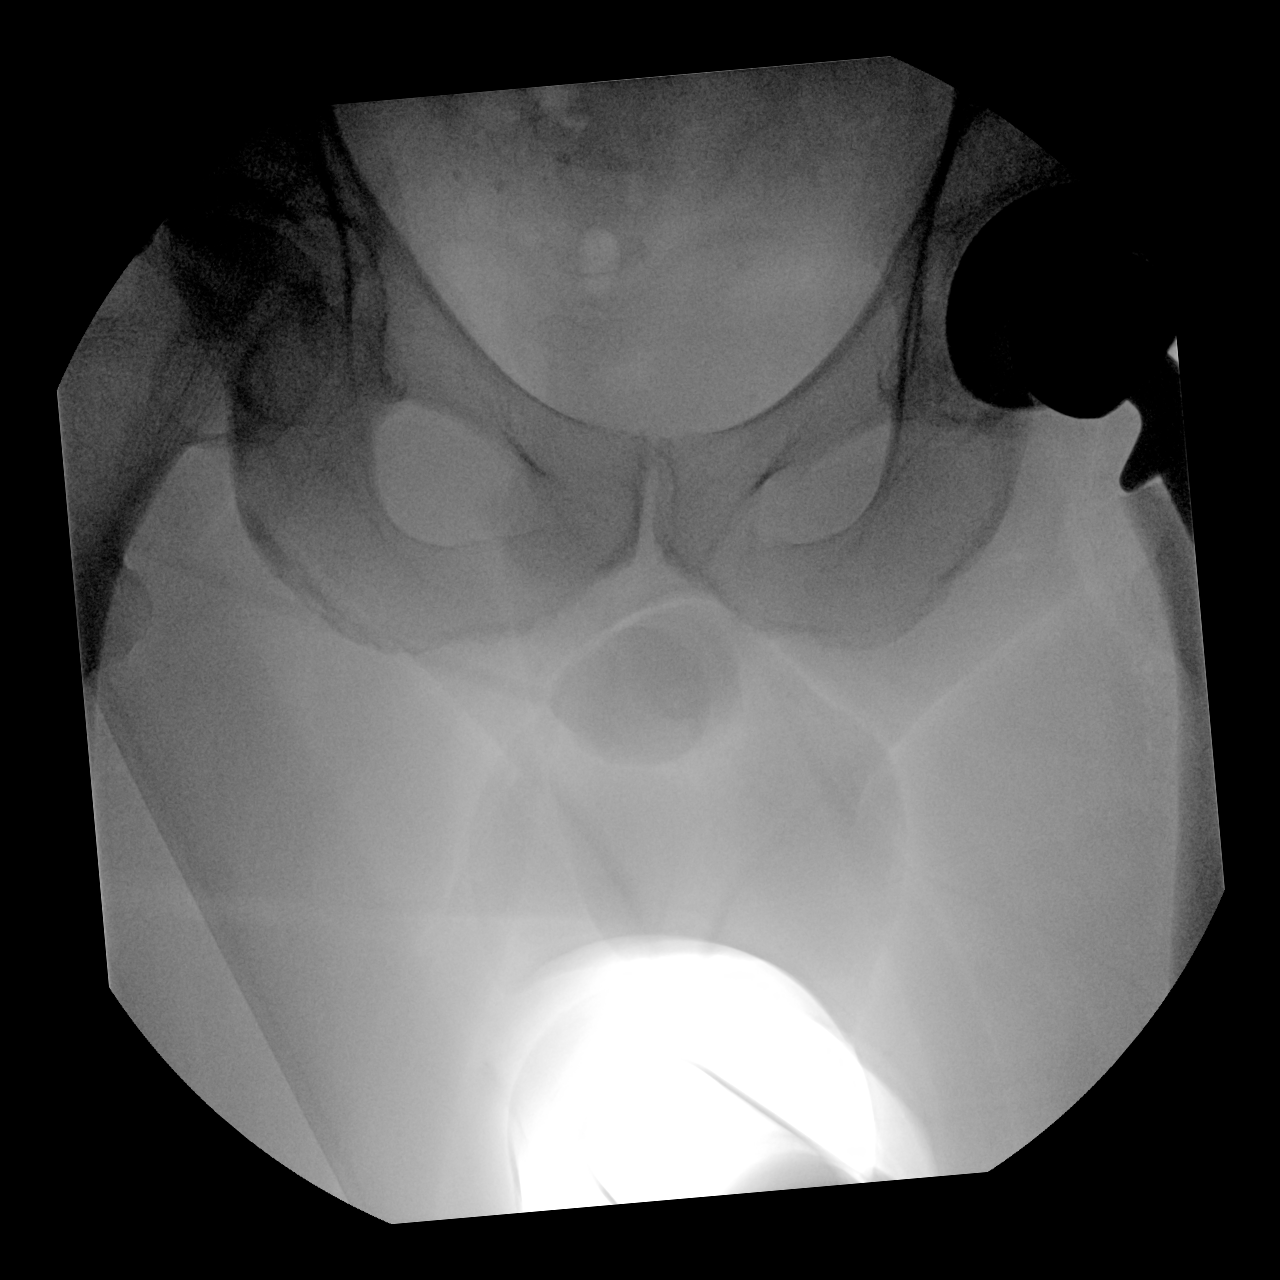

[2 of 2 positions shown; findings below may reference images not displayed]

FINDINGS: Two low resolution intraoperative spot views of the left hip. Total
fluoroscopy time was 32.3 seconds. The images demonstrate a left hip
replacement with normal alignment
IMPRESSION: Intraoperative fluoroscopic assistance provided during left hip
surgery

## 2020-11-24 IMAGING — DX DG HIP (WITH OR WITHOUT PELVIS) 1V PORT*L*
2 series · 2 of 2 positions shown · non-contrast
Comparison: 03/30/2019

CLINICAL DATA: Left hip arthroplasty

EXAM:
DG HIP (WITH OR WITHOUT PELVIS) 1V PORT LEFT

[pelvis ap]
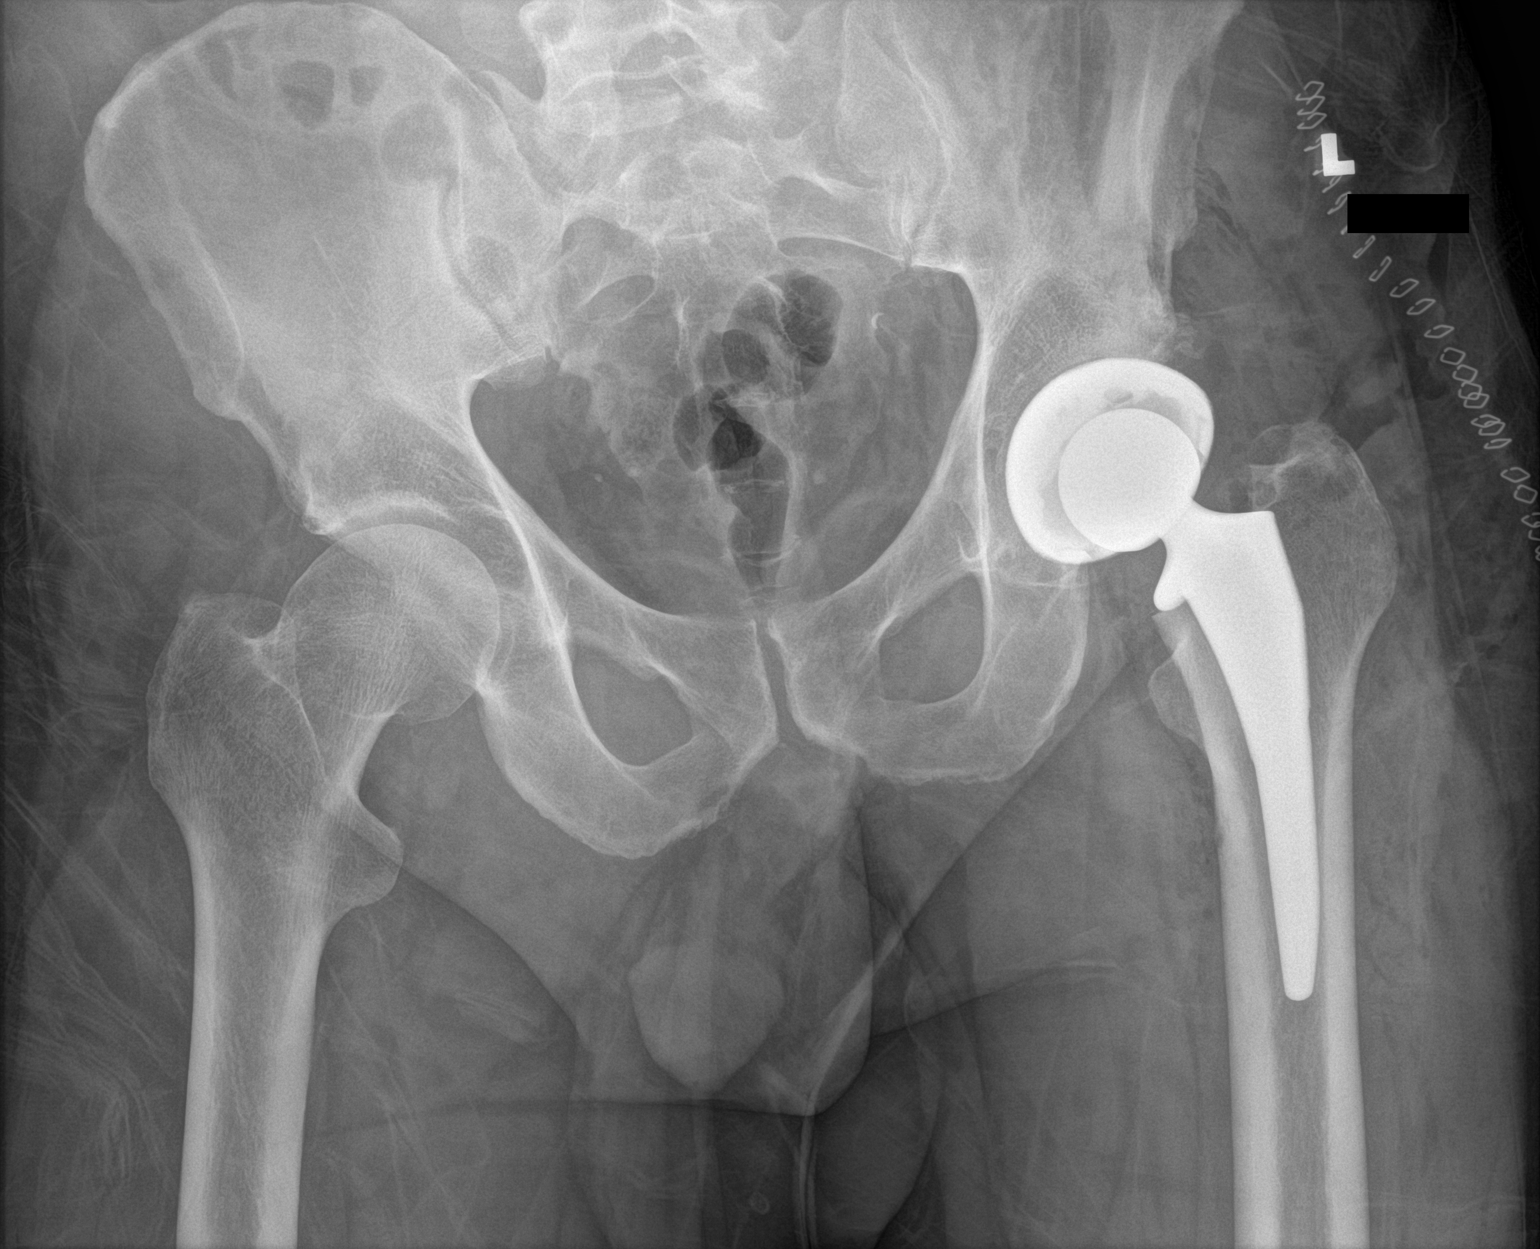

[hip lat]
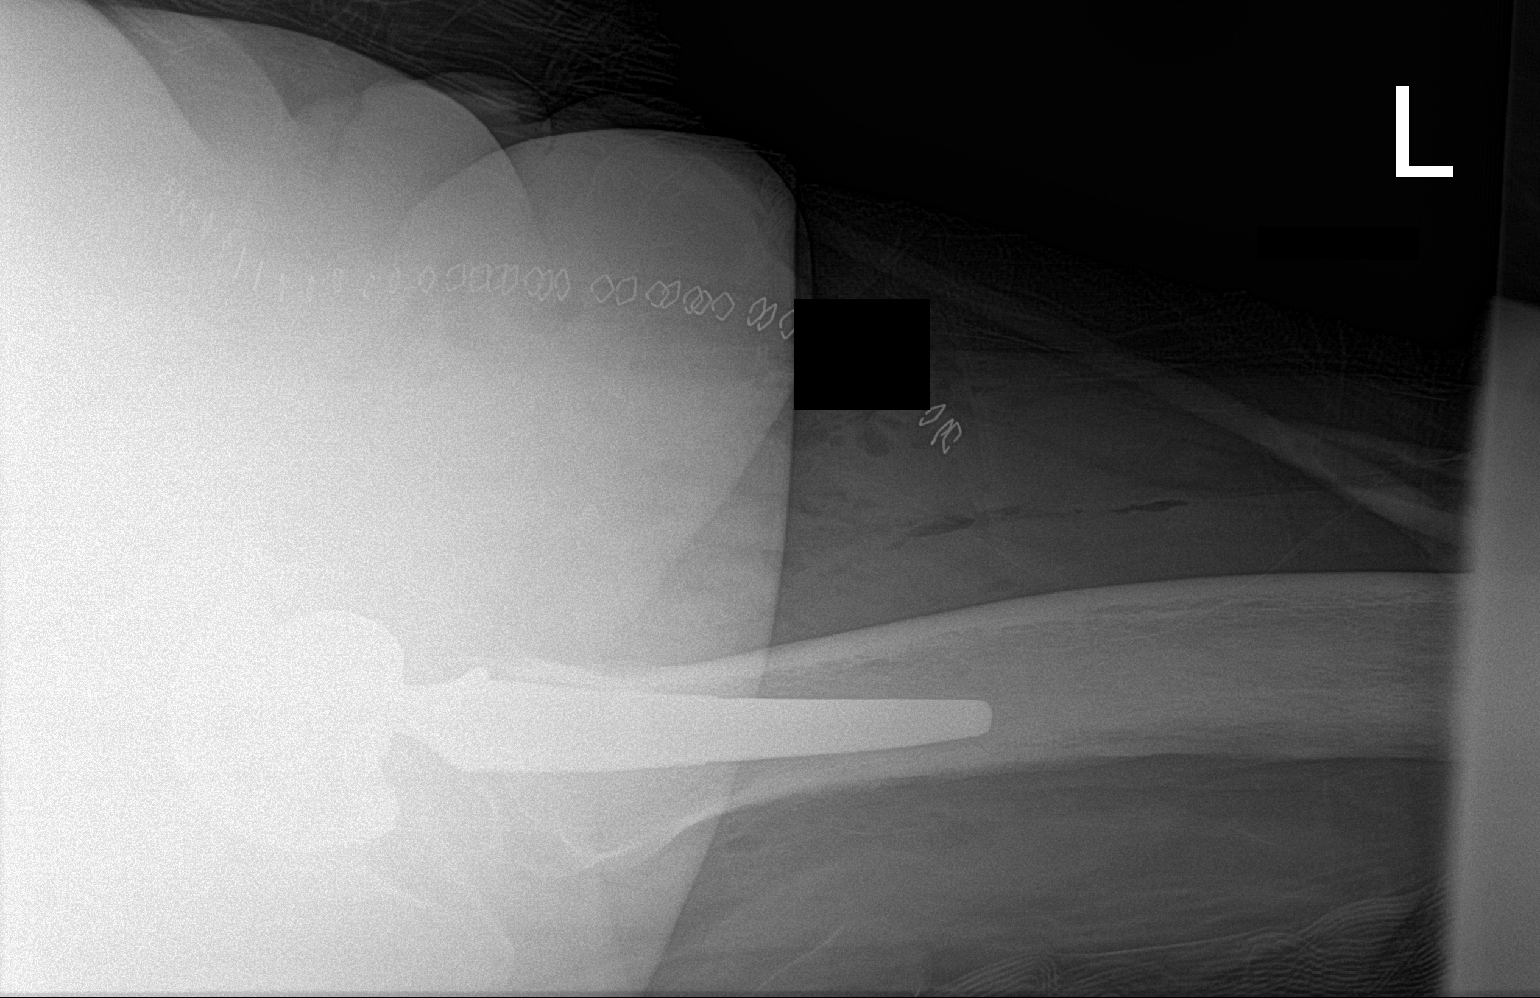

[2 of 2 positions shown; findings below may reference images not displayed]

FINDINGS: Frontal view of the pelvis as well as cross-table lateral view of
the left hip demonstrates left hip arthroplasty in the expected
position without signs of acute complication. Postsurgical changes
in the overlying soft tissues.
IMPRESSION: 1. Unremarkable left hip arthroplasty.

## 2021-04-30 ENCOUNTER — Telehealth: Payer: Self-pay | Admitting: Orthopaedic Surgery

## 2021-04-30 NOTE — Telephone Encounter (Signed)
Pt calling to speak with Dr. Ophelia Charter nurse. Would not give me a message to add only that he wanted to speak with the nurse only. The best call back number is [phone].

## 2021-04-30 NOTE — Telephone Encounter (Signed)
Patient was calling for his mother, note added to her chart.

## 2022-10-15 ENCOUNTER — Ambulatory Visit: Payer: BC Managed Care – PPO | Admitting: Orthopaedic Surgery

## 2022-10-15 ENCOUNTER — Other Ambulatory Visit (INDEPENDENT_AMBULATORY_CARE_PROVIDER_SITE_OTHER): Payer: BC Managed Care – PPO

## 2022-10-15 ENCOUNTER — Encounter: Payer: Self-pay | Admitting: Orthopaedic Surgery

## 2022-10-15 VITALS — Ht 75.0 in | Wt 255.0 lb

## 2022-10-15 DIAGNOSIS — M79671 Pain in right foot: Secondary | ICD-10-CM | POA: Diagnosis not present

## 2022-10-16 NOTE — Progress Notes (Signed)
Office Visit Note   Patient: Bryce Kelley           Date of Birth: 03/03/1971           MRN: [id number] Visit Date: 10/15/2022              Requested by: Lonie Peak, PA-C [address] [place],  [place] [address] PCP: Lonie Peak, PA-C   Assessment & Plan: Visit Diagnoses:  1. Pain of right heel     Plan: X-rays reviewed he is doing well with current inserts with arch support as well as soft area for the plantar fascia.  We discussed topical cream he can use standing heel cord stretching on a step.  Frozen orange juice or lemonade can rolling etc.  He can return if he has increased symptoms.  Follow-Up Instructions: No follow-ups on file.   Orders:  Orders Placed This Encounter  Procedures   XR Os Calcis Right   No orders of the defined types were placed in this encounter.     Procedures: No procedures performed   Clinical Data: No additional findings.   Subjective: Chief Complaint  Patient presents with   Right Heel - Pain    HPI 52 year old male 2-year follow-up states he saw podiatrist 09/06/2022 was given some custom inserts for problems of plantar fasciitis which are helping.  He is also had some pain posteriorly at the distal Achilles tendon.  He was told he had bone spurs on his heel.  He states his pain is doing a little bit better.  He has been wearing small compressive sleeve over his ankle which she is not sure if it helps he does use some Aleve and Tylenol which seems to help.  He is not really minimal limping recently.  Previously seen for some lateral epicondylitis which is doing well as well as total of arthroplasty left, 2021 without problems.  Review of Systems no chills or fever no history of gout.  History of WPW, GERD thyroid condition.   Objective: Vital Signs: Ht 6\' 3"  (1.905 m)   Wt 255 lb (115.7 kg)   BMI 31.87 kg/m   Physical Exam Constitutional:      Appearance: He is well-developed.  HENT:     Head: Normocephalic  and atraumatic.     Right Ear: External ear normal.     Left Ear: External ear normal.  Eyes:     Pupils: Pupils are equal, round, and reactive to light.  Neck:     Thyroid: No thyromegaly.     Trachea: No tracheal deviation.  Cardiovascular:     Rate and Rhythm: Normal rate.  Pulmonary:     Effort: Pulmonary effort is normal.     Breath sounds: No wheezing.  Abdominal:     General: Bowel sounds are normal.     Palpations: Abdomen is soft.  Musculoskeletal:     Cervical back: Neck supple.  Skin:    General: Skin is warm and dry.     Capillary Refill: Capillary refill takes less than 2 seconds.  Neurological:     Mental Status: He is alert and oriented to person, place, and time.  Psychiatric:        Behavior: Behavior normal.        Thought Content: Thought content normal.        Judgment: Judgment normal.     Ortho Exam patient has mild tenderness and small pump bump Achilles tendon insertion.  Minimal tenderness of the plantar  fascia insertion of the calcaneus.  No nodules noted.  He is able ambulate with a mild shoes normally.  He can heel and toe walk.  Specialty Comments:  No specialty comments available.  Imaging: XR Os Calcis Right  Result Date: 10/16/2022 2 view x-rays right calcaneus obtained and reviewed including tangential scirrhous view as well as lateral.  There is calcification of the plantar fascia insertion as well as distal Achilles insertion site consistent with calcific tendinopathy.  No subtalar or ankle arthrosis. 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  Plantar fascial spur noted on the calcaneus.    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   Dyspnea on  exertion 05/07/2019   GERD (gastroesophageal reflux disease) 05/07/2019   Past Medical History:  Diagnosis Date   Acid reflux    Anxiety    Arthritis    Depression    Dyspnea on exertion 05/07/2019   Gastric ulcer    GERD (gastroesophageal reflux disease) 05/07/2019   Hx of total hip arthroplasty, 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   Thyroid disease    Wolff-Parkinson-White syndrome     Family History  Problem Relation Age of Onset   Heart attack Father     Past Surgical History:  Procedure Laterality Date   TONSILLECTOMY     TOTAL HIP ARTHROPLASTY Left 08/30/2019   Procedure: LEFT TOTAL HIP ARTHROPLASTY ANTERIOR APPROACH;  Surgeon: Eldred Manges, MD;  Location: [facility] OR;  Service: Orthopedics;  Laterality: Left;   Social History   Occupational History   Not on file  Tobacco Use   Smoking status: Never   Smokeless tobacco: Never  Vaping Use   Vaping Use: Never used  Substance and Sexual Activity   Alcohol use: Not Currently   Drug use: Never   Sexual activity: Not on file

## 2023-01-04 NOTE — Progress Notes (Deleted)
Cardiology Office Note:  .   Date:  01/04/2023  ID:  Bryce Kelley, DOB 1970/11/24, MRN [id number] PCP: Lonie Peak, Cordelia Poche  [facility] Providers Cardiologist:  Gypsy Balsam, MD    History of Present Illness: .   Bryce Kelley is a 52 y.o. male with a past medical history of WPW, s/p left hip replacement, family history of heart disease. Patient is followed by Dr. Bing Matter and presents today for ***  Per chart review, patient was first seen by Dr. Bing Matter in 05/07/2019 for preoperative evaluation.  At that appointment, patient reported that years ago he was told that he had WPW.  Patient denied having any symptoms related to his WPW.  Dr. Bing Matter obtained an EKG, and reported that the EKG was not diagnostic.  Dr. Bing Matter was skeptical of the diagnosis of WPW.  He did undergo echocardiogram on 05/13/2019 that showed EF 60-65%, no LVH, no regional wall motion abnormalities, normal RV function, no valvular abnormalities. He was cleared for hip surgery.   He was seen again by Dr. Stephens Shire on 02/17/2020. Patient reported that his hip surgery overall went well. However, about 6 hours after surgery, he had a syncopal episode when he stood up out of bed. He did not have any further episodes of syncope. EKG at his appointment again did not show evidence of WPW. Again, Dr. Bing Matter questioned his previous diagnosis. Patient has not been seen by cardiology since that time  Family history of CAD   WPW, possible  - Patient reportedly was told years ago that he had WPW. Dr. Bing Matter has been skeptical of the diagnosis - Patient only has 3 EKGs in our system- I reviewed them, and none of them show evidence of WPW - Patient denies sustained palpitations/tachycardia, lightheadedness, syncope, near syncope     ROS: ***  Studies Reviewed: .        *** Risk Assessment/Calculations:   {Does this patient have ATRIAL FIBRILLATION?[phone]} No BP recorded.  {Refresh Note OR Click  here to enter BP  :1}***       Physical Exam:   VS:  There were no vitals taken for this visit.   Wt Readings from Last 3 Encounters:  10/15/22 255 lb (115.7 kg)  06/06/20 254 lb (115.2 kg)  03/14/20 254 lb (115.2 kg)    GEN: Well nourished, well developed in no acute distress NECK: No JVD; No carotid bruits CARDIAC: ***RRR, no murmurs, rubs, gallops RESPIRATORY:  Clear to auscultation without rales, wheezing or rhonchi  ABDOMEN: Soft, non-tender, non-distended EXTREMITIES:  No edema; No deformity   ASSESSMENT AND PLAN: .   ***    {Are you ordering a CV Procedure (e.g. stress test, cath, DCCV, TEE, etc)?   Press F2        :[id number]}  Dispo: ***  Signed, Jonita Albee, PA-C

## 2023-01-06 ENCOUNTER — Ambulatory Visit: Payer: BC Managed Care – PPO | Admitting: Cardiology

## 2023-01-30 NOTE — Progress Notes (Deleted)
  Cardiology Office Note:  .   Date:  01/30/2023  ID:  Bryce Kelley, DOB 19-Feb-1971, MRN [id number] PCP: Bryce Peak, PA-C  [facility] Providers Cardiologist:  Bryce Balsam, MD    History of Present Illness: .   Bryce Kelley is a 52 y.o. male with a past medical history of WPW, s/p left hip replacement, family history of heart disease. Patient is followed by Dr. Bing Kelley and presents today for ***  Per chart review, patient was first seen by Dr. Bing Kelley in 05/07/2019 for preoperative evaluation.  At that appointment, patient reported that years ago he was told that he had WPW.  Patient denied having any symptoms related to his WPW.  Dr. Bing Kelley obtained an EKG, and reported that the EKG was not diagnostic.  Dr. Bing Kelley was skeptical of the diagnosis of WPW.  He did undergo echocardiogram on 05/13/2019 that showed EF 60-65%, no LVH, no regional wall motion abnormalities, normal RV function, no valvular abnormalities. He was cleared for hip surgery.   He was seen again by Dr. Stephens Kelley on 02/17/2020. Patient reported that his hip surgery overall went well. However, about 6 hours after surgery, he had a syncopal episode when he stood up out of bed. He did not have any further episodes of syncope. EKG at his appointment again did not show evidence of WPW. Again, Dr. Bing Kelley questioned his previous diagnosis. Patient has not been seen by cardiology since that time  Family history of CAD   WPW, possible  - Patient reportedly was told years ago that he had WPW. Dr. Bing Kelley has been skeptical of the diagnosis - Patient only has 3 EKGs in our system- I reviewed them, and none of them show evidence of WPW - Patient denies sustained palpitations/tachycardia, lightheadedness, syncope, near syncope     ROS: ***  Studies Reviewed: .        *** Risk Assessment/Calculations:   {Does this patient have ATRIAL FIBRILLATION?[phone]} No BP recorded.  {Refresh Note OR Click  here to enter BP  :1}***       Physical Exam:   VS:  There were no vitals taken for this visit.   Wt Readings from Last 3 Encounters:  10/15/22 255 lb (115.7 kg)  06/06/20 254 lb (115.2 kg)  03/14/20 254 lb (115.2 kg)    GEN: Well nourished, well developed in no acute distress NECK: No JVD; No carotid bruits CARDIAC: ***RRR, no murmurs, rubs, gallops RESPIRATORY:  Clear to auscultation without rales, wheezing or rhonchi  ABDOMEN: Soft, non-tender, non-distended EXTREMITIES:  No edema; No deformity   ASSESSMENT AND PLAN: .   ***    {Are you ordering a CV Procedure (e.g. stress test, cath, DCCV, TEE, etc)?   Press F2        :[id number]}  Dispo: ***  Signed, Bryce Albee, PA-C

## 2023-02-07 ENCOUNTER — Ambulatory Visit: Payer: BC Managed Care – PPO | Admitting: Cardiology

## 2023-03-05 NOTE — Progress Notes (Deleted)
  Cardiology Office Note:  .   Date:  03/05/2023  ID:  Bryce Kelley, DOB May 07, 1970, MRN [id number] PCP: Lonie Peak, PA-C  [facility] Providers Cardiologist:  Gypsy Balsam, MD    History of Present Illness: .   Bryce Kelley is a 52 y.o. male with a past medical history of WPW, s/p left hip replacement, family history of heart disease. Patient is followed by Dr. Bing Matter and presents today for ***  Per chart review, patient was first seen by Dr. Bing Matter in 05/07/2019 for preoperative evaluation.  At that appointment, patient reported that years ago he was told that he had WPW.  Patient denied having any symptoms related to his WPW.  Dr. Bing Matter obtained an EKG, and reported that the EKG was not diagnostic.  Dr. Bing Matter was skeptical of the diagnosis of WPW.  He did undergo echocardiogram on 05/13/2019 that showed EF 60-65%, no LVH, no regional wall motion abnormalities, normal RV function, no valvular abnormalities. He was cleared for hip surgery.   He was seen again by Dr. Stephens Shire on 02/17/2020. Patient reported that his hip surgery overall went well. However, about 6 hours after surgery, he had a syncopal episode when he stood up out of bed. He did not have any further episodes of syncope. EKG at his appointment again did not show evidence of WPW. Again, Dr. Bing Matter questioned his previous diagnosis. Patient has not been seen by cardiology since that time  Family history of CAD   WPW, possible  - Patient reportedly was told years ago that he had WPW. Dr. Bing Matter has been skeptical of the diagnosis - Patient only has 3 EKGs in our system- I reviewed them, and none of them show evidence of WPW - Patient denies sustained palpitations/tachycardia, lightheadedness, syncope, near syncope     ROS: ***  Studies Reviewed: .        *** Risk Assessment/Calculations:   {Does this patient have ATRIAL FIBRILLATION?:[phone]} No BP recorded.  {Refresh Note OR Click  here to enter BP  :1}***       Physical Exam:   VS:  There were no vitals taken for this visit.   Wt Readings from Last 3 Encounters:  10/15/22 255 lb (115.7 kg)  06/06/20 254 lb (115.2 kg)  03/14/20 254 lb (115.2 kg)    GEN: Well nourished, well developed in no acute distress NECK: No JVD; No carotid bruits CARDIAC: ***RRR, no murmurs, rubs, gallops RESPIRATORY:  Clear to auscultation without rales, wheezing or rhonchi  ABDOMEN: Soft, non-tender, non-distended EXTREMITIES:  No edema; No deformity   ASSESSMENT AND PLAN: .   ***    {Are you ordering a CV Procedure (e.g. stress test, cath, DCCV, TEE, etc)?   Press F2        :[id number]}  Dispo: ***  Signed, Jonita Albee, PA-C

## 2023-03-14 ENCOUNTER — Ambulatory Visit: Payer: BC Managed Care – PPO | Admitting: Cardiology

## 2023-04-06 NOTE — Progress Notes (Deleted)
   Cardiology Office Note    Date:  04/06/2023  ID:  Bryce Kelley, DOB Sep 09, 1970, MRN [id number] PCP:  Bryce Peak, PA-C  Cardiologist:  Bryce Balsam, MD  Electrophysiologist:  None   Chief Complaint: ***  History of Present Illness: .    Bryce Kelley is a 52 y.o. male with visit-pertinent history of WPW, s/p left hip replacement, family history of heart disease. Patient is followed by Dr. Bing Kelley and presents today for ***  On chart review patient was first seen by Dr. Bing Kelley in 04/2019 for preoperative evaluation.  Patient reported that appointment that he been told years ago that he had WPW.  Denied having any symptoms related.  Dr. Bing Kelley reported that EKG obtained was not diagnostic, he was skeptical of the diagnosis of WPW.  Echocardiogram on 05/13/2019 showed EF 60 to 65%, no LVH, no RWMA, normal RV function, no valvular abnormalities.  He was cleared for his hip surgery.  He was last seen in clinic by Dr. Bing Kelley on 02/17/2020.  Patient reported that his hip surgery went well however 6 hours after surgery he had a syncopal episode when he stood up out of bed.  Denies any further episodes of syncope.  EKG at appointment did not show evidence of WPW. He was then lost to follow-up.  Today he presents for follow-up.  He reports that he  ?WPW: Patient reports that years ago he was told he had WPW.  Dr. Bing Kelley has been overall skeptical of the diagnosis.  Prior EKG showed no evidence of WPW. He denies sustained palpitations/tachycardia, lightheadedness, syncope or near syncope. Family history of CAD:    Labwork independently reviewed:   ROS: .    Please see the history of present illness. Otherwise, review of systems is positive for ***.  All other systems are reviewed and otherwise negative.  Studies Reviewed: Marland Kitchen    EKG:  EKG is ordered today, personally reviewed, demonstrating ***  CV Studies: Cardiac studies reviewed are outlined and summarized  above. Otherwise please see EMR for full report.   Current Reported Medications:.    No outpatient medications have been marked as taking for the 04/08/23 encounter (Appointment) with Bryce Harbour, NP.    Physical Exam:    VS:  There were no vitals taken for this visit.   Wt Readings from Last 3 Encounters:  10/15/22 255 lb (115.7 kg)  06/06/20 254 lb (115.2 kg)  03/14/20 254 lb (115.2 kg)    GEN: Well nourished, well developed in no acute distress NECK: No JVD; No carotid bruits CARDIAC: ***RRR, no murmurs, rubs, gallops RESPIRATORY:  Clear to auscultation without rales, wheezing or rhonchi  ABDOMEN: Soft, non-tender, non-distended EXTREMITIES:  No edema; No acute deformity   Asessement and Plan:.     ***     Disposition: F/u with ***  Signed, Bryce Harbour, NP

## 2023-04-08 ENCOUNTER — Ambulatory Visit: Payer: BC Managed Care – PPO | Admitting: Cardiology

## 2023-05-01 ENCOUNTER — Ambulatory Visit: Payer: BC Managed Care – PPO | Attending: Cardiology | Admitting: Cardiology

## 2023-05-01 ENCOUNTER — Encounter: Payer: Self-pay | Admitting: Cardiology

## 2023-05-01 VITALS — BP 156/88 | HR 75 | Ht 75.0 in | Wt 266.6 lb

## 2023-05-01 DIAGNOSIS — Z96642 Presence of left artificial hip joint: Secondary | ICD-10-CM | POA: Diagnosis not present

## 2023-05-01 DIAGNOSIS — Z8249 Family history of ischemic heart disease and other diseases of the circulatory system: Secondary | ICD-10-CM | POA: Insufficient documentation

## 2023-05-01 DIAGNOSIS — R0609 Other forms of dyspnea: Secondary | ICD-10-CM

## 2023-05-01 DIAGNOSIS — I456 Pre-excitation syndrome: Secondary | ICD-10-CM

## 2023-05-01 NOTE — Progress Notes (Signed)
 Cardiology Consultation:    Date:  05/01/2023   ID:  Bryce Kelley, DOB 18-Nov-1970, MRN [id number]  PCP:  Montey Lot, PA-C  Cardiologist:  Lamar Fitch, MD   Referring MD: Montey Lot, PA-C   Chief Complaint  Patient presents with   Establish Care    History of Present Illness:    Bryce Kelley is a 53 y.o. male who is being seen today for the evaluation of risk factors modifications at the request of Montey Lot, [name].  I did see him in 2021 and the reason for the visit was supposedly WPW however since have now him all EKG did not show any evidence of preexcitation I have also no documentation of orthodromic accessory pathway conduction clearly no WPW on EKG at that time echocardiogram has been done did well.  He was sent to us  before hip surgery he went to hip surgery with no difficulties and doing quite well thereafter.  This time he wants to be seen because he is worried about his health he his parents end up having myocardial infarction mild LVH moderate before 18 father before 106 both of them were smoker.  Likely patient does not have any symptoms he does have any chest pain tightness squeezing pressure burning chest.  He does have station back that he tried to use on the regular basis I have no difficulty doing it.  He does not smoke he is not on any special diet.  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    Ascorbic Acid (VITAMIN C PO) Take 1,600 mg by mouth daily.   Calcium Citrate (CITRACAL PO) Take 1 tablet by mouth every other day.   fexofenadine (ALLEGRA) 60 MG tablet Take 60 mg by mouth daily.   GARLIC PO Take 1,500 mg by mouth daily.   montelukast  (SINGULAIR ) 10 MG tablet Take 10 mg by mouth daily.   Multiple Vitamin (MULTIVITAMIN WITH MINERALS) TABS tablet Take 1 tablet by mouth daily.   Multiple Vitamins-Minerals (ICAPS AREDS 2 PO) Take 1 capsule by mouth 2 (two) times daily.   Nutritional Supplements (VITAMIN D MAINTENANCE PO) Take 1 capsule by mouth daily.   Omega-3 Fatty Acids (OMEGA 3 500) 500 MG CAPS Take 500 mg by mouth daily.   omeprazole (PRILOSEC) 20 MG capsule Take 20 mg by mouth daily.    OVER THE COUNTER MEDICATION Take 1 tablet by mouth daily. Immunety otc supplment   Phenylephrine -Acetaminophen  (TYLENOL  SINUS+HEADACHE PO) Take 2 tablets by mouth at bedtime as needed (congestion).   [DISCONTINUED] loratadine  (CLARITIN ) 10 MG tablet Take 10 mg by mouth daily.     Allergies:   Ibuprofen-diphenhydramine cit, Motrin [ibuprofen], and Amoxicillin   Social History   Socioeconomic History   Marital  status: Divorced    Spouse name: Not on file   Number of children: Not on file   Years of education: Not on file   Highest education level: Not on file  Occupational History   Not on file  Tobacco Use   Smoking status: Never   Smokeless tobacco: Never  Vaping Use   Vaping status: Never Used  Substance and Sexual Activity   Alcohol use: Not Currently   Drug use: Never   Sexual activity: Not on file  Other Topics Concern   Not on file  Social History Narrative   Not on file   Social Drivers of Health   Financial Resource Strain: Not on file  Food Insecurity: Not on file  Transportation Needs: Not on file  Physical Activity: Not on  file  Stress: Not on file  Social Connections: Not on file     Family History: The patient's family history includes Heart attack in his father. ROS:   Please see the history of present illness.    All 14 point review of systems negative except as described per history of present illness.  EKGs/Labs/Other Studies Reviewed:    The following studies were reviewed today:   EKG:  EKG Interpretation Date/Time:  Thursday May 01 2023 15:56:18 EST Ventricular Rate:  75 PR Interval:  174 QRS Duration:  98 QT Interval:  362 QTC Calculation: 404 R Axis:   -14  Text Interpretation: Normal sinus rhythm with sinus arrhythmia Normal ECG When compared with ECG of 31-Aug-2019 13:55, Vent. rate has decreased BY  55 BPM Confirmed by Bernie Charleston [phone]) on 05/01/2023 4:19:12 PM    Recent Labs: No results found for requested labs within last 365 days.  Recent Lipid Panel No results found for: CHOL, TRIG, HDL, CHOLHDL, VLDL, LDLCALC, LDLDIRECT  Physical Exam:    VS:  BP (!) 156/88 (BP Location: Right Arm, Patient Position: Sitting)   Pulse 75   Ht 6' 3 (1.905 m)   Wt 266 lb 9.6 oz (120.9 kg)   SpO2 94%   BMI 33.32 kg/m     Wt Readings from Last 3 Encounters:  05/01/23 266 lb 9.6 oz (120.9 kg)  10/15/22 255 lb (115.7 kg)  06/06/20 254 lb (115.2 kg)     GEN:  Well nourished, well developed in no acute distress HEENT: Normal NECK: No JVD; No carotid bruits LYMPHATICS: No lymphadenopathy CARDIAC: RRR, no murmurs, no rubs, no gallops RESPIRATORY:  Clear to auscultation without rales, wheezing or rhonchi  ABDOMEN: Soft, non-tender, non-distended MUSCULOSKELETAL:  No edema; No deformity  SKIN: Warm and dry NEUROLOGIC:  Alert and oriented x 3 PSYCHIATRIC:  Normal affect   ASSESSMENT:    1. Family history of heart disease   2. Wolff-Parkinson-White syndrome   3. Dyspnea on exertion   4. Hx of total hip arthroplasty, left   5. Family history of premature  coronary artery disease    PLAN:    In order of problems listed above:  Risk factors modifications and assessment.  We had a long discussion about what to do with the situation I do have his cholesterol LDL 101 HDL 54.  Will get calcium score 20 determine how aggressive we need to do any management for his problem.  I will also check his LP(a). Dyslipidemia last cholesterol from February 2024 I will reassess his cholesterol with LP(a). Dyspnea exertion denies having any. History of hip arthroplasty stable. We did talk about healthy lifestyle need to exercise on the regular  basis I recommended 5 times a week 30 minutes moderate intensity exercise.  We did also talk about diet and I did discuss with him basic of Mediterranean diet.   Medication Adjustments/Labs and Tests Ordered: Current medicines are reviewed at length with the patient today.  Concerns regarding medicines are outlined above.  Orders Placed This Encounter  Procedures   EKG 12-Lead   No orders of the defined types were placed in this encounter.   Signed, Lamar DOROTHA Fitch, MD, [facility]. 05/01/2023 4:37 PM    [facility]

## 2023-05-01 NOTE — Patient Instructions (Addendum)
 Medication Instructions:  Your physician recommends that you continue on your current medications as directed. Please refer to the Current Medication list given to you today.  *If you need a refill on your cardiac medications before your next appointment, please call your pharmacy*   Lab Work: Your physician recommends that you return for lab work in: when fasting You need to have labs done when you are fasting.  You can come Monday through Friday 8:30 am to 12:00 pm and 1:15 to 4:30. You do not need to make an appointment as the order has already been placed. The labs you are going to have done are Lpa,  Lipids.    Testing/Procedures:  CALL AFTER JAN 20th We will order CT coronary calcium score. It will cost $99.00 and is not covered by insurance.  Please call to schedule.    [facility] [address] [place], [place] [address] [phone]    Follow-Up: At [facility], you and your health needs are our priority.  As part of our continuing mission to provide you with exceptional heart care, we have created designated Provider Care Teams.  These Care Teams include your primary Cardiologist (physician) and Advanced Practice Providers (APPs -  Physician Assistants and Nurse Practitioners) who all work together to provide you with the care you need, when you need it.  We recommend signing up for the patient portal called MyChart.  Sign up information is provided on this After Visit Summary.  MyChart is used to connect with patients for Virtual Visits (Telemedicine).  Patients are able to view lab/test results, encounter notes, upcoming appointments, etc.  Non-urgent messages can be sent to your provider as well.   To learn more about what you can do with MyChart, go to [email or web address].    Your next appointment:   6 month(s)  The format for your next appointment:   In Person  Provider:   Lamar Fitch, MD    Other Instructions NA

## 2023-05-13 ENCOUNTER — Other Ambulatory Visit (HOSPITAL_BASED_OUTPATIENT_CLINIC_OR_DEPARTMENT_OTHER): Payer: BC Managed Care – PPO | Admitting: Radiology

## 2023-05-20 ENCOUNTER — Ambulatory Visit (HOSPITAL_BASED_OUTPATIENT_CLINIC_OR_DEPARTMENT_OTHER)
Admission: RE | Admit: 2023-05-20 | Discharge: 2023-05-20 | Disposition: A | Discharge status: Home / Self Care | Payer: Self-pay | Source: Ambulatory Visit | Attending: Cardiology | Admitting: Cardiology

## 2023-05-20 DIAGNOSIS — Z8249 Family history of ischemic heart disease and other diseases of the circulatory system: Secondary | ICD-10-CM

## 2023-06-03 ENCOUNTER — Telehealth: Payer: Self-pay

## 2023-06-03 NOTE — Telephone Encounter (Signed)
Left message on My Chart with CT Score results per Dr. Vanetta Shawl note. Routed to PCP.
